# Patient Record
Sex: Female | Born: 1935 | Race: White | Hispanic: No | Marital: Single | Smoking: Former smoker
Health system: Southern US, Community
[De-identification: ages and names within clinical notes are randomized; demographics above are authoritative.]

## PROBLEM LIST (undated history)

## (undated) DIAGNOSIS — D519 Vitamin B12 deficiency anemia, unspecified: Secondary | ICD-10-CM

## (undated) DIAGNOSIS — F329 Major depressive disorder, single episode, unspecified: Secondary | ICD-10-CM

## (undated) DIAGNOSIS — I10 Essential (primary) hypertension: Secondary | ICD-10-CM

## (undated) DIAGNOSIS — J449 Chronic obstructive pulmonary disease, unspecified: Secondary | ICD-10-CM

## (undated) DIAGNOSIS — K219 Gastro-esophageal reflux disease without esophagitis: Secondary | ICD-10-CM

## (undated) DIAGNOSIS — R05 Cough: Secondary | ICD-10-CM

## (undated) DIAGNOSIS — Z87891 Personal history of nicotine dependence: Secondary | ICD-10-CM

## (undated) DIAGNOSIS — E785 Hyperlipidemia, unspecified: Secondary | ICD-10-CM

## (undated) DIAGNOSIS — M4800 Spinal stenosis, site unspecified: Secondary | ICD-10-CM

## (undated) DIAGNOSIS — F419 Anxiety disorder, unspecified: Secondary | ICD-10-CM

## (undated) DIAGNOSIS — M549 Dorsalgia, unspecified: Secondary | ICD-10-CM

## (undated) DIAGNOSIS — G8929 Other chronic pain: Secondary | ICD-10-CM

## (undated) DIAGNOSIS — M8580 Other specified disorders of bone density and structure, unspecified site: Secondary | ICD-10-CM

## (undated) DIAGNOSIS — H9190 Unspecified hearing loss, unspecified ear: Secondary | ICD-10-CM

## (undated) DIAGNOSIS — K5790 Diverticulosis of intestine, part unspecified, without perforation or abscess without bleeding: Secondary | ICD-10-CM

## (undated) DIAGNOSIS — S32000A Wedge compression fracture of unspecified lumbar vertebra, initial encounter for closed fracture: Secondary | ICD-10-CM

## (undated) DIAGNOSIS — J45909 Unspecified asthma, uncomplicated: Secondary | ICD-10-CM

## (undated) HISTORY — DX: Vitamin B12 deficiency anemia, unspecified: D51.9

## (undated) HISTORY — PX: OTHER SURGICAL HISTORY: SHX169

## (undated) HISTORY — PX: CATARACT EXTRACTION, BILATERAL: SHX1313

## (undated) HISTORY — DX: Gastro-esophageal reflux disease without esophagitis: K21.9

## (undated) HISTORY — DX: Personal history of nicotine dependence: Z87.891

## (undated) HISTORY — DX: Diverticulosis of intestine, part unspecified, without perforation or abscess without bleeding: K57.90

## (undated) HISTORY — DX: Other chronic pain: G89.29

## (undated) HISTORY — DX: Spinal stenosis, site unspecified: M48.00

## (undated) HISTORY — DX: Essential (primary) hypertension: I10

## (undated) HISTORY — PX: APPENDECTOMY: SHX54

## (undated) HISTORY — DX: Cough: R05

## (undated) HISTORY — DX: Dorsalgia, unspecified: M54.9

## (undated) HISTORY — DX: Wedge compression fracture of unspecified lumbar vertebra, initial encounter for closed fracture: S32.000A

## (undated) HISTORY — DX: Other specified disorders of bone density and structure, unspecified site: M85.80

## (undated) HISTORY — DX: Hyperlipidemia, unspecified: E78.5

## (undated) HISTORY — DX: Major depressive disorder, single episode, unspecified: F32.9

## (undated) HISTORY — DX: Anxiety disorder, unspecified: F41.9

---

## 1999-01-21 DIAGNOSIS — Z87891 Personal history of nicotine dependence: Secondary | ICD-10-CM

## 1999-01-21 HISTORY — DX: Personal history of nicotine dependence: Z87.891

## 2000-09-20 ENCOUNTER — Emergency Department (HOSPITAL_COMMUNITY): Admission: EM | Admit: 2000-09-20 | Discharge: 2000-09-20 | Payer: Self-pay | Admitting: Emergency Medicine

## 2008-01-21 HISTORY — PX: KYPHOPLASTY: SHX5884

## 2015-03-07 DIAGNOSIS — M25512 Pain in left shoulder: Secondary | ICD-10-CM | POA: Diagnosis not present

## 2015-04-30 DIAGNOSIS — Z961 Presence of intraocular lens: Secondary | ICD-10-CM | POA: Diagnosis not present

## 2015-05-01 DIAGNOSIS — M25552 Pain in left hip: Secondary | ICD-10-CM | POA: Diagnosis not present

## 2015-05-01 DIAGNOSIS — M1612 Unilateral primary osteoarthritis, left hip: Secondary | ICD-10-CM | POA: Diagnosis not present

## 2015-05-04 DIAGNOSIS — M25552 Pain in left hip: Secondary | ICD-10-CM | POA: Diagnosis not present

## 2015-05-04 DIAGNOSIS — J45909 Unspecified asthma, uncomplicated: Secondary | ICD-10-CM | POA: Diagnosis not present

## 2015-05-04 DIAGNOSIS — M25551 Pain in right hip: Secondary | ICD-10-CM | POA: Diagnosis not present

## 2015-06-20 DIAGNOSIS — M1612 Unilateral primary osteoarthritis, left hip: Secondary | ICD-10-CM | POA: Diagnosis not present

## 2015-06-20 DIAGNOSIS — M25551 Pain in right hip: Secondary | ICD-10-CM | POA: Diagnosis not present

## 2015-06-20 DIAGNOSIS — Z2239 Carrier of other specified bacterial diseases: Secondary | ICD-10-CM | POA: Diagnosis not present

## 2015-06-20 DIAGNOSIS — M79605 Pain in left leg: Secondary | ICD-10-CM | POA: Diagnosis not present

## 2015-06-26 DIAGNOSIS — M1612 Unilateral primary osteoarthritis, left hip: Secondary | ICD-10-CM | POA: Diagnosis not present

## 2015-07-02 DIAGNOSIS — Z96642 Presence of left artificial hip joint: Secondary | ICD-10-CM | POA: Diagnosis not present

## 2015-07-02 DIAGNOSIS — M169 Osteoarthritis of hip, unspecified: Secondary | ICD-10-CM | POA: Diagnosis not present

## 2015-07-02 DIAGNOSIS — Z471 Aftercare following joint replacement surgery: Secondary | ICD-10-CM | POA: Diagnosis not present

## 2015-07-02 DIAGNOSIS — I10 Essential (primary) hypertension: Secondary | ICD-10-CM | POA: Diagnosis present

## 2015-07-02 DIAGNOSIS — J449 Chronic obstructive pulmonary disease, unspecified: Secondary | ICD-10-CM | POA: Diagnosis present

## 2015-07-02 DIAGNOSIS — M1612 Unilateral primary osteoarthritis, left hip: Secondary | ICD-10-CM | POA: Diagnosis not present

## 2015-07-02 DIAGNOSIS — K219 Gastro-esophageal reflux disease without esophagitis: Secondary | ICD-10-CM | POA: Diagnosis present

## 2015-07-05 DIAGNOSIS — Z96642 Presence of left artificial hip joint: Secondary | ICD-10-CM | POA: Diagnosis not present

## 2015-07-05 DIAGNOSIS — Z471 Aftercare following joint replacement surgery: Secondary | ICD-10-CM | POA: Diagnosis not present

## 2015-07-09 DIAGNOSIS — Z471 Aftercare following joint replacement surgery: Secondary | ICD-10-CM | POA: Diagnosis not present

## 2015-07-09 DIAGNOSIS — Z96642 Presence of left artificial hip joint: Secondary | ICD-10-CM | POA: Diagnosis not present

## 2015-07-11 DIAGNOSIS — Z96642 Presence of left artificial hip joint: Secondary | ICD-10-CM | POA: Diagnosis not present

## 2015-07-11 DIAGNOSIS — Z471 Aftercare following joint replacement surgery: Secondary | ICD-10-CM | POA: Diagnosis not present

## 2015-07-16 DIAGNOSIS — Z96642 Presence of left artificial hip joint: Secondary | ICD-10-CM | POA: Diagnosis not present

## 2015-07-16 DIAGNOSIS — Z471 Aftercare following joint replacement surgery: Secondary | ICD-10-CM | POA: Diagnosis not present

## 2015-07-18 DIAGNOSIS — Z471 Aftercare following joint replacement surgery: Secondary | ICD-10-CM | POA: Diagnosis not present

## 2015-07-18 DIAGNOSIS — Z96642 Presence of left artificial hip joint: Secondary | ICD-10-CM | POA: Diagnosis not present

## 2015-07-23 DIAGNOSIS — Z96642 Presence of left artificial hip joint: Secondary | ICD-10-CM | POA: Diagnosis not present

## 2015-07-23 DIAGNOSIS — Z471 Aftercare following joint replacement surgery: Secondary | ICD-10-CM | POA: Diagnosis not present

## 2015-07-27 DIAGNOSIS — Z96642 Presence of left artificial hip joint: Secondary | ICD-10-CM | POA: Diagnosis not present

## 2015-07-27 DIAGNOSIS — Z471 Aftercare following joint replacement surgery: Secondary | ICD-10-CM | POA: Diagnosis not present

## 2015-07-30 DIAGNOSIS — Z471 Aftercare following joint replacement surgery: Secondary | ICD-10-CM | POA: Diagnosis not present

## 2015-07-30 DIAGNOSIS — Z96642 Presence of left artificial hip joint: Secondary | ICD-10-CM | POA: Diagnosis not present

## 2015-07-31 DIAGNOSIS — Z471 Aftercare following joint replacement surgery: Secondary | ICD-10-CM | POA: Diagnosis not present

## 2015-07-31 DIAGNOSIS — Z96642 Presence of left artificial hip joint: Secondary | ICD-10-CM | POA: Diagnosis not present

## 2015-08-02 DIAGNOSIS — M1612 Unilateral primary osteoarthritis, left hip: Secondary | ICD-10-CM | POA: Diagnosis not present

## 2015-08-02 DIAGNOSIS — Z471 Aftercare following joint replacement surgery: Secondary | ICD-10-CM | POA: Diagnosis not present

## 2015-08-02 DIAGNOSIS — Z96642 Presence of left artificial hip joint: Secondary | ICD-10-CM | POA: Diagnosis not present

## 2015-08-06 DIAGNOSIS — Z471 Aftercare following joint replacement surgery: Secondary | ICD-10-CM | POA: Diagnosis not present

## 2015-08-06 DIAGNOSIS — Z96642 Presence of left artificial hip joint: Secondary | ICD-10-CM | POA: Diagnosis not present

## 2015-08-08 DIAGNOSIS — Z471 Aftercare following joint replacement surgery: Secondary | ICD-10-CM | POA: Diagnosis not present

## 2015-08-08 DIAGNOSIS — Z96642 Presence of left artificial hip joint: Secondary | ICD-10-CM | POA: Diagnosis not present

## 2015-08-13 DIAGNOSIS — Z96642 Presence of left artificial hip joint: Secondary | ICD-10-CM | POA: Diagnosis not present

## 2015-08-13 DIAGNOSIS — Z471 Aftercare following joint replacement surgery: Secondary | ICD-10-CM | POA: Diagnosis not present

## 2015-08-14 DIAGNOSIS — Z471 Aftercare following joint replacement surgery: Secondary | ICD-10-CM | POA: Diagnosis not present

## 2015-08-14 DIAGNOSIS — Z96642 Presence of left artificial hip joint: Secondary | ICD-10-CM | POA: Diagnosis not present

## 2015-08-15 DIAGNOSIS — Z96642 Presence of left artificial hip joint: Secondary | ICD-10-CM | POA: Diagnosis not present

## 2015-08-15 DIAGNOSIS — Z471 Aftercare following joint replacement surgery: Secondary | ICD-10-CM | POA: Diagnosis not present

## 2015-08-16 DIAGNOSIS — Z96642 Presence of left artificial hip joint: Secondary | ICD-10-CM | POA: Diagnosis not present

## 2015-08-16 DIAGNOSIS — Z471 Aftercare following joint replacement surgery: Secondary | ICD-10-CM | POA: Diagnosis not present

## 2015-08-20 DIAGNOSIS — R1011 Right upper quadrant pain: Secondary | ICD-10-CM | POA: Diagnosis not present

## 2015-08-20 DIAGNOSIS — I10 Essential (primary) hypertension: Secondary | ICD-10-CM | POA: Diagnosis not present

## 2015-08-20 DIAGNOSIS — J45909 Unspecified asthma, uncomplicated: Secondary | ICD-10-CM | POA: Diagnosis not present

## 2015-08-20 DIAGNOSIS — G629 Polyneuropathy, unspecified: Secondary | ICD-10-CM | POA: Diagnosis not present

## 2015-08-20 DIAGNOSIS — Z96649 Presence of unspecified artificial hip joint: Secondary | ICD-10-CM | POA: Diagnosis not present

## 2015-09-06 DIAGNOSIS — G5622 Lesion of ulnar nerve, left upper limb: Secondary | ICD-10-CM | POA: Diagnosis not present

## 2015-09-06 DIAGNOSIS — M1612 Unilateral primary osteoarthritis, left hip: Secondary | ICD-10-CM | POA: Diagnosis not present

## 2015-09-18 DIAGNOSIS — R110 Nausea: Secondary | ICD-10-CM | POA: Diagnosis not present

## 2015-10-26 DIAGNOSIS — M25572 Pain in left ankle and joints of left foot: Secondary | ICD-10-CM | POA: Diagnosis not present

## 2015-11-05 DIAGNOSIS — M76822 Posterior tibial tendinitis, left leg: Secondary | ICD-10-CM | POA: Diagnosis not present

## 2015-11-05 DIAGNOSIS — R601 Generalized edema: Secondary | ICD-10-CM | POA: Diagnosis not present

## 2015-11-05 DIAGNOSIS — M79672 Pain in left foot: Secondary | ICD-10-CM | POA: Diagnosis not present

## 2015-11-05 DIAGNOSIS — R262 Difficulty in walking, not elsewhere classified: Secondary | ICD-10-CM | POA: Diagnosis not present

## 2015-11-13 DIAGNOSIS — M21862 Other specified acquired deformities of left lower leg: Secondary | ICD-10-CM | POA: Diagnosis not present

## 2015-11-13 DIAGNOSIS — M76822 Posterior tibial tendinitis, left leg: Secondary | ICD-10-CM | POA: Diagnosis not present

## 2015-11-29 DIAGNOSIS — M21862 Other specified acquired deformities of left lower leg: Secondary | ICD-10-CM | POA: Diagnosis not present

## 2015-11-29 DIAGNOSIS — G5622 Lesion of ulnar nerve, left upper limb: Secondary | ICD-10-CM | POA: Diagnosis not present

## 2015-11-29 DIAGNOSIS — R202 Paresthesia of skin: Secondary | ICD-10-CM | POA: Diagnosis not present

## 2015-11-29 DIAGNOSIS — M76822 Posterior tibial tendinitis, left leg: Secondary | ICD-10-CM | POA: Diagnosis not present

## 2015-12-03 DIAGNOSIS — G5622 Lesion of ulnar nerve, left upper limb: Secondary | ICD-10-CM | POA: Diagnosis not present

## 2015-12-06 DIAGNOSIS — Z23 Encounter for immunization: Secondary | ICD-10-CM | POA: Diagnosis not present

## 2016-04-16 DIAGNOSIS — M4800 Spinal stenosis, site unspecified: Secondary | ICD-10-CM | POA: Diagnosis not present

## 2016-04-16 DIAGNOSIS — K219 Gastro-esophageal reflux disease without esophagitis: Secondary | ICD-10-CM | POA: Diagnosis not present

## 2016-04-16 DIAGNOSIS — I10 Essential (primary) hypertension: Secondary | ICD-10-CM | POA: Diagnosis not present

## 2016-04-16 DIAGNOSIS — Z23 Encounter for immunization: Secondary | ICD-10-CM | POA: Diagnosis not present

## 2016-04-16 DIAGNOSIS — E785 Hyperlipidemia, unspecified: Secondary | ICD-10-CM | POA: Diagnosis not present

## 2016-04-17 DIAGNOSIS — I10 Essential (primary) hypertension: Secondary | ICD-10-CM | POA: Diagnosis not present

## 2016-04-17 DIAGNOSIS — D649 Anemia, unspecified: Secondary | ICD-10-CM | POA: Diagnosis not present

## 2016-04-17 DIAGNOSIS — K219 Gastro-esophageal reflux disease without esophagitis: Secondary | ICD-10-CM | POA: Diagnosis not present

## 2016-04-17 DIAGNOSIS — E785 Hyperlipidemia, unspecified: Secondary | ICD-10-CM | POA: Diagnosis not present

## 2016-04-17 DIAGNOSIS — F419 Anxiety disorder, unspecified: Secondary | ICD-10-CM | POA: Diagnosis not present

## 2016-04-17 DIAGNOSIS — M8580 Other specified disorders of bone density and structure, unspecified site: Secondary | ICD-10-CM | POA: Diagnosis not present

## 2016-04-17 DIAGNOSIS — F329 Major depressive disorder, single episode, unspecified: Secondary | ICD-10-CM | POA: Diagnosis not present

## 2016-04-17 DIAGNOSIS — J449 Chronic obstructive pulmonary disease, unspecified: Secondary | ICD-10-CM | POA: Diagnosis not present

## 2016-04-17 DIAGNOSIS — M25519 Pain in unspecified shoulder: Secondary | ICD-10-CM | POA: Diagnosis not present

## 2016-04-17 DIAGNOSIS — Z79899 Other long term (current) drug therapy: Secondary | ICD-10-CM | POA: Diagnosis not present

## 2016-04-17 DIAGNOSIS — R109 Unspecified abdominal pain: Secondary | ICD-10-CM | POA: Diagnosis not present

## 2016-04-17 DIAGNOSIS — G629 Polyneuropathy, unspecified: Secondary | ICD-10-CM | POA: Diagnosis not present

## 2016-05-14 DIAGNOSIS — H353190 Nonexudative age-related macular degeneration, unspecified eye, stage unspecified: Secondary | ICD-10-CM | POA: Diagnosis not present

## 2016-05-14 DIAGNOSIS — H353191 Nonexudative age-related macular degeneration, unspecified eye, early dry stage: Secondary | ICD-10-CM | POA: Diagnosis not present

## 2016-05-14 DIAGNOSIS — H353131 Nonexudative age-related macular degeneration, bilateral, early dry stage: Secondary | ICD-10-CM | POA: Diagnosis not present

## 2016-05-14 DIAGNOSIS — Z961 Presence of intraocular lens: Secondary | ICD-10-CM | POA: Diagnosis not present

## 2016-11-07 DIAGNOSIS — Z23 Encounter for immunization: Secondary | ICD-10-CM | POA: Diagnosis not present

## 2017-01-08 DIAGNOSIS — J441 Chronic obstructive pulmonary disease with (acute) exacerbation: Secondary | ICD-10-CM | POA: Diagnosis not present

## 2017-01-08 DIAGNOSIS — J020 Streptococcal pharyngitis: Secondary | ICD-10-CM | POA: Diagnosis not present

## 2017-01-11 ENCOUNTER — Encounter (HOSPITAL_COMMUNITY): Payer: Self-pay | Admitting: Emergency Medicine

## 2017-01-11 ENCOUNTER — Emergency Department (HOSPITAL_COMMUNITY): Payer: Medicare Other

## 2017-01-11 ENCOUNTER — Emergency Department (HOSPITAL_COMMUNITY)
Admission: EM | Admit: 2017-01-11 | Discharge: 2017-01-11 | Disposition: A | Discharge status: Home / Self Care | Payer: Medicare Other | Attending: Emergency Medicine | Admitting: Emergency Medicine

## 2017-01-11 DIAGNOSIS — J029 Acute pharyngitis, unspecified: Secondary | ICD-10-CM | POA: Insufficient documentation

## 2017-01-11 DIAGNOSIS — J441 Chronic obstructive pulmonary disease with (acute) exacerbation: Secondary | ICD-10-CM | POA: Insufficient documentation

## 2017-01-11 DIAGNOSIS — R05 Cough: Secondary | ICD-10-CM | POA: Diagnosis not present

## 2017-01-11 HISTORY — DX: Chronic obstructive pulmonary disease, unspecified: J44.9

## 2017-01-11 HISTORY — DX: Unspecified asthma, uncomplicated: J45.909

## 2017-01-11 LAB — BLOOD GAS, VENOUS
Acid-Base Excess: 6 mmol/L — ABNORMAL HIGH (ref 0.0–2.0)
Bicarbonate: 29.9 mmol/L — ABNORMAL HIGH (ref 20.0–28.0)
DRAWN BY: 514251
O2 CONTENT: 21 L/min
O2 SAT: 34.1 %
PATIENT TEMPERATURE: 98.6
pCO2, Ven: 41.6 mmHg — ABNORMAL LOW (ref 44.0–60.0)
pH, Ven: 7.47 — ABNORMAL HIGH (ref 7.250–7.430)

## 2017-01-11 LAB — CBC WITH DIFFERENTIAL/PLATELET
BASOS PCT: 0 %
Basophils Absolute: 0 10*3/uL (ref 0.0–0.1)
EOS ABS: 0 10*3/uL (ref 0.0–0.7)
Eosinophils Relative: 0 %
HCT: 39.5 % (ref 36.0–46.0)
HEMOGLOBIN: 13.2 g/dL (ref 12.0–15.0)
Lymphocytes Relative: 12 %
Lymphs Abs: 1.1 10*3/uL (ref 0.7–4.0)
MCH: 28.9 pg (ref 26.0–34.0)
MCHC: 33.4 g/dL (ref 30.0–36.0)
MCV: 86.4 fL (ref 78.0–100.0)
MONOS PCT: 10 %
Monocytes Absolute: 0.9 10*3/uL (ref 0.1–1.0)
NEUTROS PCT: 78 %
Neutro Abs: 7.2 10*3/uL (ref 1.7–7.7)
Platelets: 234 10*3/uL (ref 150–400)
RBC: 4.57 MIL/uL (ref 3.87–5.11)
RDW: 14.1 % (ref 11.5–15.5)
WBC: 9.2 10*3/uL (ref 4.0–10.5)

## 2017-01-11 LAB — COMPREHENSIVE METABOLIC PANEL
ALBUMIN: 3.9 g/dL (ref 3.5–5.0)
ALK PHOS: 56 U/L (ref 38–126)
ALT: 26 U/L (ref 14–54)
ANION GAP: 11 (ref 5–15)
AST: 42 U/L — ABNORMAL HIGH (ref 15–41)
BUN: 24 mg/dL — ABNORMAL HIGH (ref 6–20)
CO2: 27 mmol/L (ref 22–32)
Calcium: 9 mg/dL (ref 8.9–10.3)
Chloride: 100 mmol/L — ABNORMAL LOW (ref 101–111)
Creatinine, Ser: 1.03 mg/dL — ABNORMAL HIGH (ref 0.44–1.00)
GFR calc Af Amer: 58 mL/min — ABNORMAL LOW (ref >60)
GFR calc non Af Amer: 50 mL/min — ABNORMAL LOW (ref >60)
GLUCOSE: 117 mg/dL — AB (ref 65–99)
POTASSIUM: 3.2 mmol/L — AB (ref 3.5–5.1)
SODIUM: 138 mmol/L (ref 135–145)
Total Bilirubin: 0.4 mg/dL (ref 0.3–1.2)
Total Protein: 7.5 g/dL (ref 6.5–8.1)

## 2017-01-11 MED ORDER — METHYLPREDNISOLONE SODIUM SUCC 125 MG IJ SOLR
125.0000 mg | Freq: Once | INTRAMUSCULAR | Status: AC
  Administered 2017-01-11: 125 mg via INTRAVENOUS
  Filled 2017-01-11: qty 2

## 2017-01-11 MED ORDER — OXYMETAZOLINE HCL 0.05 % NA SOLN
1.0000 | Freq: Once | NASAL | Status: AC
Start: 2017-01-11 — End: 2017-01-11
  Administered 2017-01-11: 1 via NASAL
  Filled 2017-01-11: qty 15

## 2017-01-11 MED ORDER — PREDNISONE 10 MG PO TABS: ORAL_TABLET | ORAL | 0 refills | Status: DC

## 2017-01-11 MED ORDER — IPRATROPIUM BROMIDE 0.02 % IN SOLN
0.5000 mg | Freq: Once | RESPIRATORY_TRACT | Status: AC
  Administered 2017-01-11: 0.5 mg via RESPIRATORY_TRACT
  Filled 2017-01-11: qty 2.5

## 2017-01-11 MED ORDER — SODIUM CHLORIDE 0.9 % IV BOLUS (SEPSIS)
1000.0000 mL | Freq: Once | INTRAVENOUS | Status: AC
  Administered 2017-01-11: 1000 mL via INTRAVENOUS

## 2017-01-11 MED ORDER — ALBUTEROL SULFATE (2.5 MG/3ML) 0.083% IN NEBU
5.0000 mg | INHALATION_SOLUTION | Freq: Once | RESPIRATORY_TRACT | Status: AC
  Administered 2017-01-11: 5 mg via RESPIRATORY_TRACT
  Filled 2017-01-11: qty 6

## 2017-01-11 MED ORDER — ALBUTEROL SULFATE (2.5 MG/3ML) 0.083% IN NEBU: 2.5000 mg | INHALATION_SOLUTION | Freq: Four times a day (QID) | RESPIRATORY_TRACT | 12 refills | Status: DC | PRN

## 2017-01-11 MED ORDER — HYDROCOD POLST-CPM POLST ER 10-8 MG/5ML PO SUER: 5.0000 mL | Freq: Every evening | ORAL | 0 refills | Status: DC | PRN

## 2017-01-11 NOTE — ED Triage Notes (Addendum)
Patient c/o sore throat, cough, and congestion since 3 days. Reports she was diagnosed with [facility] at [facility]. States she has been taking amoxicillin as prescribed with no relief. Hx pneumonia, asthma, and COPD.

## 2017-01-11 NOTE — ED Provider Notes (Signed)
[facility]-EMERGENCY DEPT Provider Note   CSN: [id number] Arrival date & time: 01/11/17  1631     History   Chief Complaint Chief Complaint  Patient presents with  . Cough  . Sore Throat    HPI Desiree [name] is a 81 y.o. female history of asthma, COPD here presenting with worsening cough, congestion.  Patient only moved here from [place].  Patient had some sore throat and cough and congestion about 3 days ago.  Patient went to urgent care and had a positive strep and patient was given a prescription of amoxicillin, prednisone taper, albuterol as needed.  Patient states that despite taking these medicines, she has been having persistent congestion and cough.  States that she has been having some wheezing as well.  Denies any persistent fevers.  Patient was concern for COPD exacerbation.  Patient states that she had no recent admission for COPD.   The history is provided by the patient.    Past Medical History:  Diagnosis Date  . Asthma   . COPD (chronic obstructive pulmonary disease) (HCC)     There are no active problems to display for this patient.    OB History    No data available       Home Medications    Prior to Admission medications   Medication Sig Start Date End Date Taking? Authorizing Provider  albuterol (PROVENTIL HFA;VENTOLIN HFA) 108 (90 Base) MCG/ACT inhaler Inhale 1-2 puffs into the lungs every 4 (four) hours as needed for wheezing or shortness of breath.   Yes [provider]  amoxicillin (AMOXIL) 500 MG tablet Take 500 mg by mouth 3 (three) times daily.   Yes [provider]  benzonatate (TESSALON) 200 MG capsule Take 200 mg by mouth 3 (three) times daily as needed for cough.   Yes [provider]  BUPROPION HCL ER, XL, PO Take 1 tablet by mouth daily.   Yes [provider]  CYANOCOBALAMIN PO Take 1 tablet by mouth at bedtime.   Yes [provider]  Fluticasone-Salmeterol (ADVAIR)  250-50 MCG/DOSE AEPB Inhale 1 puff into the lungs 2 (two) times daily.   Yes [provider]  HYDROCHLOROTHIAZIDE PO Take 1 tablet by mouth daily.   Yes [provider]  montelukast (SINGULAIR) 10 MG tablet Take 10 mg by mouth at bedtime.   Yes [provider]  FLUZONE HIGH-DOSE 0.5 ML injection Inject 0.5 mLs into the muscle once. 11/07/16   [provider]  predniSONE (DELTASONE) 10 MG tablet Take 10 mg by mouth See admin instructions. 40mg  day 1, 30 mg day 2, 20 mg day 3, 10 mg day 4    [provider]    Family History No family history on file.  Social History Social History   Tobacco Use  . Smoking status: Not on file  Substance Use Topics  . Alcohol use: Not on file  . Drug use: Not on file     Allergies   Levaquin [levofloxacin] and Percocet [oxycodone-acetaminophen]   Review of Systems Review of Systems  Respiratory: Positive for cough.   All other systems reviewed and are negative.    Physical Exam Updated Vital Signs BP 114/69   Pulse 70   Temp 98.3 F (36.8 C) (Oral)   Resp 14   SpO2 98%   Physical Exam  Constitutional: She appears well-developed.  Slightly tachypneic, + coughing   HENT:  Head: Normocephalic.  Mouth/Throat: Uvula is midline, oropharynx is clear and moist  and mucous membranes are normal.  Eyes: EOM are normal. Pupils are equal, round, and reactive to light.  Neck: Normal range of motion. Neck supple.  Cardiovascular: Normal rate, regular rhythm and normal heart sounds.  Pulmonary/Chest:  Slightly tachypneic, mild diffuse wheezing, no retractions   Abdominal: Soft. Bowel sounds are normal.  Neurological: She is alert.  Skin: Skin is warm.  Psychiatric: She has a normal mood and affect.  Nursing note and vitals reviewed.    ED Treatments / Results  Labs (all labs ordered are listed, but only abnormal results are displayed) Labs Reviewed  BLOOD GAS, VENOUS - Abnormal; Notable for the  following components:      Result Value   pH, Ven 7.470 (*)    pCO2, Ven 41.6 (*)    Bicarbonate 29.9 (*)    Acid-Base Excess 6.0 (*)    All other components within normal limits  COMPREHENSIVE METABOLIC PANEL - Abnormal; Notable for the following components:   Potassium 3.2 (*)    Chloride 100 (*)    Glucose, Bld 117 (*)    BUN 24 (*)    Creatinine, Ser 1.03 (*)    AST 42 (*)    GFR calc non Af Amer 50 (*)    GFR calc Af Amer 58 (*)    All other components within normal limits  CBC WITH DIFFERENTIAL/PLATELET    EKG  EKG Interpretation None       Radiology Dg Chest 2 View  Result Date: 01/11/2017 CLINICAL DATA:  Cough EXAM: CHEST  2 VIEW COMPARISON:  None. FINDINGS: There is hyperinflation of the lungs compatible with COPD. Heart and mediastinal contours are within normal limits. No focal opacities or effusions. No acute bony abnormality. IMPRESSION: COPD.  No active disease. Electronically Signed   By: Charlett NoseKevin  Dover M.D.   On: 01/11/2017 17:46    Procedures Procedures (including critical care time)  Medications Ordered in ED Medications  albuterol (PROVENTIL) (2.5 MG/3ML) 0.083% nebulizer solution 5 mg (5 mg Nebulization Given 01/11/17 1950)  oxymetazoline (AFRIN) 0.05 % nasal spray 1 spray (1 spray Each Nare Given 01/11/17 1938)  methylPREDNISolone sodium succinate (SOLU-MEDROL) 125 mg/2 mL injection 125 mg (125 mg Intravenous Given 01/11/17 1938)  sodium chloride 0.9 % bolus 1,000 mL (1,000 mLs Intravenous New Bag/Given 01/11/17 1938)  ipratropium (ATROVENT) nebulizer solution 0.5 mg (0.5 mg Nebulization Given 01/11/17 1950)     Initial Impression / Assessment and Plan / ED Course  I have reviewed the triage vital signs and the nursing notes.  Pertinent labs & imaging results that were available during my care of the patient were reviewed by me and considered in my medical decision making (see chart for details).     Desiree [name] is a 81 y.o. female here with  cough, congestion. Likely COPD exacerbation. Still on amoxicillin for strep pharyngitis and OP appears clear with no signs of PTA or RPA. Has diffuse wheezing but doesn't appear in distress. Will get labs, CXR. Will give nebs, steroids.   8:33 PM Labs unremarkable. PH 7.47, CO2 41. CXR showed no pneumonia. Given nebs, steroids, wheezing improved. No distress. Will dc home with another course of prednisone, albuterol nebs prn. She requests tussionex for cough and I told her to only use it at night as needed.   Final Clinical Impressions(s) / ED Diagnoses   Final diagnoses:  None    ED Discharge Orders    None       Charlynne PanderYao, Lavone Barrientes Hsienta, MD 01/11/17  2035  

## 2017-01-11 NOTE — Discharge Instructions (Signed)
Take prednisone as prescribed.   Finish amoxicillin as prescribed.   Use albuterol every 4-6 hrs as needed for cough or wheezing.   You may take tussionex at night for cough. It will make you sleepy   See your doctor in a week   Return to ER if you have worse cough, shortness of breath, trouble breathing.

## 2017-02-13 ENCOUNTER — Other Ambulatory Visit (INDEPENDENT_AMBULATORY_CARE_PROVIDER_SITE_OTHER): Payer: Medicare Other

## 2017-02-13 ENCOUNTER — Encounter: Payer: Self-pay | Admitting: Internal Medicine

## 2017-02-13 ENCOUNTER — Ambulatory Visit (INDEPENDENT_AMBULATORY_CARE_PROVIDER_SITE_OTHER): Payer: Medicare Other | Admitting: Internal Medicine

## 2017-02-13 VITALS — BP 150/90 | HR 74 | Temp 97.9°F | Ht 62.0 in | Wt 142.5 lb

## 2017-02-13 DIAGNOSIS — I10 Essential (primary) hypertension: Secondary | ICD-10-CM | POA: Diagnosis not present

## 2017-02-13 DIAGNOSIS — J441 Chronic obstructive pulmonary disease with (acute) exacerbation: Secondary | ICD-10-CM

## 2017-02-13 DIAGNOSIS — M4800 Spinal stenosis, site unspecified: Secondary | ICD-10-CM | POA: Insufficient documentation

## 2017-02-13 DIAGNOSIS — R05 Cough: Secondary | ICD-10-CM | POA: Insufficient documentation

## 2017-02-13 DIAGNOSIS — S32000A Wedge compression fracture of unspecified lumbar vertebra, initial encounter for closed fracture: Secondary | ICD-10-CM | POA: Insufficient documentation

## 2017-02-13 DIAGNOSIS — F419 Anxiety disorder, unspecified: Secondary | ICD-10-CM

## 2017-02-13 DIAGNOSIS — R739 Hyperglycemia, unspecified: Secondary | ICD-10-CM

## 2017-02-13 DIAGNOSIS — K5790 Diverticulosis of intestine, part unspecified, without perforation or abscess without bleeding: Secondary | ICD-10-CM

## 2017-02-13 DIAGNOSIS — F329 Major depressive disorder, single episode, unspecified: Secondary | ICD-10-CM | POA: Insufficient documentation

## 2017-02-13 DIAGNOSIS — G8929 Other chronic pain: Secondary | ICD-10-CM

## 2017-02-13 DIAGNOSIS — M8580 Other specified disorders of bone density and structure, unspecified site: Secondary | ICD-10-CM

## 2017-02-13 DIAGNOSIS — D519 Vitamin B12 deficiency anemia, unspecified: Secondary | ICD-10-CM

## 2017-02-13 DIAGNOSIS — J45909 Unspecified asthma, uncomplicated: Secondary | ICD-10-CM | POA: Insufficient documentation

## 2017-02-13 DIAGNOSIS — E785 Hyperlipidemia, unspecified: Secondary | ICD-10-CM

## 2017-02-13 DIAGNOSIS — Z0000 Encounter for general adult medical examination without abnormal findings: Secondary | ICD-10-CM

## 2017-02-13 DIAGNOSIS — K219 Gastro-esophageal reflux disease without esophagitis: Secondary | ICD-10-CM | POA: Insufficient documentation

## 2017-02-13 DIAGNOSIS — F32A Depression, unspecified: Secondary | ICD-10-CM

## 2017-02-13 DIAGNOSIS — R053 Chronic cough: Secondary | ICD-10-CM

## 2017-02-13 DIAGNOSIS — J449 Chronic obstructive pulmonary disease, unspecified: Secondary | ICD-10-CM | POA: Insufficient documentation

## 2017-02-13 DIAGNOSIS — Z0001 Encounter for general adult medical examination with abnormal findings: Secondary | ICD-10-CM | POA: Insufficient documentation

## 2017-02-13 DIAGNOSIS — M549 Dorsalgia, unspecified: Secondary | ICD-10-CM

## 2017-02-13 HISTORY — DX: Chronic cough: R05.3

## 2017-02-13 HISTORY — DX: Other chronic pain: G89.29

## 2017-02-13 HISTORY — DX: Gastro-esophageal reflux disease without esophagitis: K21.9

## 2017-02-13 HISTORY — DX: Anxiety disorder, unspecified: F41.9

## 2017-02-13 HISTORY — DX: Essential (primary) hypertension: I10

## 2017-02-13 HISTORY — DX: Vitamin B12 deficiency anemia, unspecified: D51.9

## 2017-02-13 HISTORY — DX: Hyperlipidemia, unspecified: E78.5

## 2017-02-13 HISTORY — DX: Other specified disorders of bone density and structure, unspecified site: M85.80

## 2017-02-13 HISTORY — DX: Spinal stenosis, site unspecified: M48.00

## 2017-02-13 HISTORY — DX: Diverticulosis of intestine, part unspecified, without perforation or abscess without bleeding: K57.90

## 2017-02-13 HISTORY — DX: Depression, unspecified: F32.A

## 2017-02-13 LAB — HEPATIC FUNCTION PANEL
ALK PHOS: 58 U/L (ref 39–117)
ALT: 13 U/L (ref 0–35)
AST: 14 U/L (ref 0–37)
Albumin: 3.9 g/dL (ref 3.5–5.2)
BILIRUBIN DIRECT: 0.1 mg/dL (ref 0.0–0.3)
TOTAL PROTEIN: 7.2 g/dL (ref 6.0–8.3)
Total Bilirubin: 0.4 mg/dL (ref 0.2–1.2)

## 2017-02-13 LAB — URINALYSIS, ROUTINE W REFLEX MICROSCOPIC
BILIRUBIN URINE: NEGATIVE
HGB URINE DIPSTICK: NEGATIVE
Ketones, ur: NEGATIVE
LEUKOCYTES UA: NEGATIVE
Nitrite: NEGATIVE
RBC / HPF: NONE SEEN (ref 0–?)
Specific Gravity, Urine: <=1.005 — AB (ref 1.000–1.030)
Total Protein, Urine: NEGATIVE
Urine Glucose: NEGATIVE
Urobilinogen, UA: 0.2 (ref 0.0–1.0)
WBC, UA: NONE SEEN (ref 0–?)
pH: 6 (ref 5.0–8.0)

## 2017-02-13 LAB — CBC WITH DIFFERENTIAL/PLATELET
BASOS ABS: 0.1 10*3/uL (ref 0.0–0.1)
Basophils Relative: 0.8 % (ref 0.0–3.0)
EOS ABS: 0.1 10*3/uL (ref 0.0–0.7)
Eosinophils Relative: 1.4 % (ref 0.0–5.0)
HEMATOCRIT: 36.7 % (ref 36.0–46.0)
HEMOGLOBIN: 12.2 g/dL (ref 12.0–15.0)
LYMPHS PCT: 26.3 % (ref 12.0–46.0)
Lymphs Abs: 2.9 10*3/uL (ref 0.7–4.0)
MCHC: 33.3 g/dL (ref 30.0–36.0)
MCV: 86.3 fl (ref 78.0–100.0)
MONOS PCT: 10.1 % (ref 3.0–12.0)
Monocytes Absolute: 1.1 10*3/uL — ABNORMAL HIGH (ref 0.1–1.0)
Neutro Abs: 6.7 10*3/uL (ref 1.4–7.7)
Neutrophils Relative %: 61.4 % (ref 43.0–77.0)
Platelets: 374 10*3/uL (ref 150.0–400.0)
RBC: 4.25 Mil/uL (ref 3.87–5.11)
RDW: 14.7 % (ref 11.5–15.5)
WBC: 10.9 10*3/uL — AB (ref 4.0–10.5)

## 2017-02-13 LAB — LIPID PANEL
CHOLESTEROL: 190 mg/dL (ref 0–200)
HDL: 60.5 mg/dL (ref >39.00)
LDL Cholesterol: 102 mg/dL — ABNORMAL HIGH (ref 0–99)
NonHDL: 129.47
Total CHOL/HDL Ratio: 3
Triglycerides: 138 mg/dL (ref 0.0–149.0)
VLDL: 27.6 mg/dL (ref 0.0–40.0)

## 2017-02-13 LAB — BASIC METABOLIC PANEL
BUN: 18 mg/dL (ref 6–23)
CALCIUM: 9.2 mg/dL (ref 8.4–10.5)
CO2: 32 mEq/L (ref 19–32)
CREATININE: 0.96 mg/dL (ref 0.40–1.20)
Chloride: 100 mEq/L (ref 96–112)
GFR: 59.48 mL/min — AB (ref >60.00)
Glucose, Bld: 90 mg/dL (ref 70–99)
Potassium: 3.6 mEq/L (ref 3.5–5.1)
Sodium: 141 mEq/L (ref 135–145)

## 2017-02-13 LAB — HEMOGLOBIN A1C: HEMOGLOBIN A1C: 5.9 % (ref 4.6–6.5)

## 2017-02-13 MED ORDER — AZITHROMYCIN 250 MG PO TABS: ORAL_TABLET | ORAL | 1 refills | Status: DC

## 2017-02-13 MED ORDER — HYDROCODONE-HOMATROPINE 5-1.5 MG/5ML PO SYRP: 5.0000 mL | ORAL_SOLUTION | Freq: Four times a day (QID) | ORAL | 0 refills | Status: AC | PRN

## 2017-02-13 MED ORDER — LOSARTAN POTASSIUM 100 MG PO TABS: 100.0000 mg | ORAL_TABLET | Freq: Every day | ORAL | 3 refills | Status: DC

## 2017-02-13 MED ORDER — FLUTICASONE-SALMETEROL 500-50 MCG/DOSE IN AEPB: 1.0000 | INHALATION_SPRAY | Freq: Two times a day (BID) | RESPIRATORY_TRACT | 0 refills | Status: DC

## 2017-02-13 MED ORDER — PREDNISONE 10 MG PO TABS: ORAL_TABLET | ORAL | 0 refills | Status: DC

## 2017-02-13 NOTE — Assessment & Plan Note (Signed)
Mild, asympt, for a1c with labs, watch closely for polys or cbg > 200 with steroid tx

## 2017-02-13 NOTE — Assessment & Plan Note (Signed)
stable overall by history and exam, and pt to continue medical treatment as before,  to f/u any worsening symptoms or concerns 

## 2017-02-13 NOTE — Patient Instructions (Signed)
Please take all new medication as prescribed - the antibiotic, cough medicine, and prednisone  Please take all new medication as prescribed - the increased advair for one month only  Please continue all other medications as before, and refills have been done if requested.  Please have the pharmacy call with any other refills you may need.  Please continue your efforts at being more active, low cholesterol diet, and weight control.  You are otherwise up to date with prevention measures today.  Please keep your appointments with your specialists as you may have planned  Please go to the LAB in the Basement (turn left off the elevator) for the tests to be done today  You will be contacted by phone if any changes need to be made immediately.  Otherwise, you will receive a letter about your results with an explanation, but please check with MyChart first.  Please remember to sign up for MyChart if you have not done so, as this will be important to you in the future with finding out test results, communicating by private email, and scheduling acute appointments online when needed.  Please return in 6 months, or sooner if needed

## 2017-02-13 NOTE — Assessment & Plan Note (Signed)
Mild elev today likely situational, to cont same tx,  to f/u any worsening symptoms or concerns

## 2017-02-13 NOTE — Assessment & Plan Note (Signed)

## 2017-02-13 NOTE — Progress Notes (Signed)
Subjective:    Patient ID: Desiree [name], female    DOB: 10/23/1935, 82 y.o.   MRN: [id number]  HPI  Here for wellness and to establish as new pt with daughter present and last office visit per prior PCP in hand;  Overall doing ok.  Pt denies neurological change such as new headache, facial or extremity weakness.  Pt denies polydipsia, polyuria, or low sugar symptoms. Pt states overall good compliance with treatment and medications, good tolerability, and has been trying to follow appropriate diet.  Pt denies worsening depressive symptoms, suicidal ideation or panic. No fever, night sweats, wt loss, loss of appetite, or other constitutional symptoms.  Pt states good ability with ADL's, has low fall risk, home safety reviewed and adequate, no other significant changes in hearing or vision, and not active with exercise.  Quit smoking 2001.  BP at home < 140/90.   Also, here with 1 wk onset mild prod cough acute on chronic, without high fever but with several chills, and assoc with ST, neck discomfort, ear fullness bilat,, and bronchial congestion with chest rattling, and few wheezes, very mild worsening sob. Pt denies chest pain, orthopnea, PND, increased LE swelling, palpitations, dizziness or syncope.  Was last seen at ED with similar symptoms dec 2018 Past Medical History:  Diagnosis Date  . Anxiety 02/13/2017  . Asthma   . B12 deficiency anemia 02/13/2017  . Chronic back pain 02/13/2017  . Chronic cough 02/13/2017  . COPD (chronic obstructive pulmonary disease) (HCC)   . Depression 02/13/2017  . Diverticulosis 02/13/2017  . Former smoker 2001  . GERD (gastroesophageal reflux disease) 02/13/2017  . HLD (hyperlipidemia) 02/13/2017  . HTN (hypertension) 02/13/2017  . Lumbar compression fracture (HCC)   . Osteopenia 02/13/2017  . Spinal stenosis 02/13/2017   Past Surgical History:  Procedure Laterality Date  . APPENDECTOMY    . CATARACT EXTRACTION, BILATERAL    . KYPHOPLASTY  2010  . left hip  surgury    . right hip surgury      reports that she has quit smoking. she has never used smokeless tobacco. She reports that she drinks alcohol. She reports that she does not use drugs. family history includes Depression in her father; Lung cancer in her brother and sister; Mesothelioma in her son. Allergies  Allergen Reactions  . Levaquin [Levofloxacin] Hives  . Percocet [Oxycodone-Acetaminophen] Hives   Current Outpatient Medications on File Prior to Visit  Medication Sig Dispense Refill  . albuterol (PROVENTIL) (2.5 MG/3ML) 0.083% nebulizer solution Take 3 mLs (2.5 mg total) by nebulization every 6 (six) hours as needed for wheezing or shortness of breath. 75 mL 12  . BUPROPION HCL ER, XL, PO Take 1 tablet by mouth daily.    . CYANOCOBALAMIN PO Take 1 tablet by mouth at bedtime.    . Fluticasone-Salmeterol (ADVAIR) 250-50 MCG/DOSE AEPB Inhale 1 puff into the lungs 2 (two) times daily.    Marland Kitchen. HYDROCHLOROTHIAZIDE PO Take 1 tablet by mouth daily.    . montelukast (SINGULAIR) 10 MG tablet Take 10 mg by mouth at bedtime.     No current facility-administered medications on file prior to visit.    Review of Systems Constitutional: Negative for other unusual diaphoresis, sweats, appetite or weight changes HENT: Negative for other worsening hearing loss, ear pain, facial swelling, mouth sores or neck stiffness.   Eyes: Negative for other worsening pain, redness or other visual disturbance.  Respiratory: Negative for other stridor or swelling Cardiovascular: Negative for other palpitations or  other chest pain  Gastrointestinal: Negative for worsening diarrhea or loose stools, blood in stool, distention or other pain Genitourinary: Negative for hematuria, flank pain or other change in urine volume.  Musculoskeletal: Negative for myalgias or other joint swelling.  Skin: Negative for other color change, or other wound or worsening drainage.  Neurological: Negative for other syncope or  numbness. Hematological: Negative for other adenopathy or swelling Psychiatric/Behavioral: Negative for hallucinations, other worsening agitation, SI, self-injury, or new decreased concentration All other system neg per pt    Objective:   Physical Exam BP (!) 150/90 (BP Location: Left Arm, Patient Position: Sitting, Cuff Size: Normal)   Pulse 74   Temp 97.9 F (36.6 C) (Oral)   Ht 5\' 2"  (1.575 m)   Wt 142 lb 8 oz (64.6 kg)   SpO2 96%   BMI 26.06 kg/m  VS noted, mild ill Constitutional: Pt is oriented to person, place, and time. Appears well-developed and well-nourished, in no significant distress and comfortable Head: Normocephalic and atraumatic  Eyes: Conjunctivae and EOM are normal. Pupils are equal, round, and reactive to light Right Ear: External ear normal without discharge Left Ear: External ear normal without discharge Nose: Nose without discharge or deformity Mouth/Throat: Oropharynx is without other ulcerations and moist  Neck: Normal range of motion. Neck supple. No JVD present. No tracheal deviation present or significant neck LA or mass Cardiovascular: Normal rate, regular rhythm, normal heart sounds and intact distal pulses.   Pulmonary/Chest: WOB normal and breath sounds decreased bilat without rales but with mild bilat scattered wheezing  Abdominal: Soft. Bowel sounds are normal. NT. No HSM  Musculoskeletal: Normal range of motion. Exhibits no edema Lymphadenopathy: Has no other cervical adenopathy.  Neurological: Pt is alert and oriented to person, place, and time. Pt has normal reflexes. No cranial nerve deficit. Motor grossly intact, Gait intact Skin: Skin is warm and dry. No rash noted or new ulcerations Psychiatric:  Has nervous mood and affect. Behavior is normal without agitation No other exam findings Lab Results  Component Value Date   WBC 9.2 01/11/2017   HGB 13.2 01/11/2017   HCT 39.5 01/11/2017   PLT 234 01/11/2017   GLUCOSE 117 (H) 01/11/2017    ALT 26 01/11/2017   AST 42 (H) 01/11/2017   NA 138 01/11/2017   K 3.2 (L) 01/11/2017   CL 100 (L) 01/11/2017   CREATININE 1.03 (H) 01/11/2017   BUN 24 (H) 01/11/2017   CO2 27 01/11/2017       Assessment & Plan:

## 2017-02-13 NOTE — Assessment & Plan Note (Addendum)
Mild to mod, c/w bronchitis vs pna, declines cxr, for antibx course, cough med prn, predpac asd, and bumped up advair to 500 dosing for 1 month, then return to baseline 250,  to f/u any worsening symptoms or concerns

## 2017-02-16 ENCOUNTER — Encounter: Payer: Self-pay | Admitting: Internal Medicine

## 2017-02-16 LAB — TSH: TSH: 1.74 u[IU]/mL (ref 0.35–4.50)

## 2017-02-26 ENCOUNTER — Telehealth: Payer: Self-pay | Admitting: Internal Medicine

## 2017-02-26 NOTE — Telephone Encounter (Signed)
Medical Records Rec'd from [place] [facility] [place] [facility] - Sent records via interoffice mail to Oliver BarreJames John MD [facility] 02/26/17 LM

## 2017-03-24 ENCOUNTER — Telehealth: Payer: Self-pay | Admitting: Internal Medicine

## 2017-03-24 NOTE — Telephone Encounter (Signed)
I have left a VM for patient to stop back in to sign DPR that has been scanned into the system.  Patient did not sign DPR.

## 2017-07-04 ENCOUNTER — Encounter: Payer: Self-pay | Admitting: Internal Medicine

## 2017-07-04 ENCOUNTER — Ambulatory Visit (INDEPENDENT_AMBULATORY_CARE_PROVIDER_SITE_OTHER): Payer: Medicare Other | Admitting: Internal Medicine

## 2017-07-04 VITALS — BP 142/76 | HR 88 | Temp 98.6°F | Ht 62.0 in | Wt 148.0 lb

## 2017-07-04 DIAGNOSIS — J449 Chronic obstructive pulmonary disease, unspecified: Secondary | ICD-10-CM

## 2017-07-04 DIAGNOSIS — J441 Chronic obstructive pulmonary disease with (acute) exacerbation: Secondary | ICD-10-CM

## 2017-07-04 DIAGNOSIS — J45909 Unspecified asthma, uncomplicated: Secondary | ICD-10-CM

## 2017-07-04 MED ORDER — DOXYCYCLINE HYCLATE 100 MG PO TABS: 100.0000 mg | ORAL_TABLET | Freq: Two times a day (BID) | ORAL | 0 refills | Status: DC

## 2017-07-04 MED ORDER — PREDNISONE 20 MG PO TABS: 20.0000 mg | ORAL_TABLET | Freq: Two times a day (BID) | ORAL | 0 refills | Status: DC

## 2017-07-04 MED ORDER — HYDROCODONE-HOMATROPINE 5-1.5 MG/5ML PO SYRP: 5.0000 mL | ORAL_SOLUTION | Freq: Three times a day (TID) | ORAL | 0 refills | Status: DC | PRN

## 2017-07-04 NOTE — Patient Instructions (Addendum)
Restart  Your albuterol and stay on  Controller.   Add antibiotic    And short  course prednisone .  Caution with cough med .

## 2017-07-04 NOTE — Progress Notes (Signed)
Chief Complaint  Patient presents with  . Cough    HPI: Desiree [name] 82 y.o.  Patient comes in today for [facility] Saturday clinic for   problem evaluation. Here with daughter . [place] [facility] copd  And allergy? And  Hx  pna  In past     Cough  And green phegm for days no blood fever  hasnt used albuterol yet  On controller inhalers and Singulair    usually needs help to keep from progressing    . Can take cough med hydrocodone  And daughter ask for some to help sleep  . No tobacco  Since 2001?         Past Medical History:  Diagnosis Date  . Anxiety 02/13/2017  . Asthma   . B12 deficiency anemia 02/13/2017  . Chronic back pain 02/13/2017  . Chronic cough 02/13/2017  . COPD (chronic obstructive pulmonary disease) (HCC)   . Depression 02/13/2017  . Diverticulosis 02/13/2017  . Former smoker 2001  . GERD (gastroesophageal reflux disease) 02/13/2017  . HLD (hyperlipidemia) 02/13/2017  . HTN (hypertension) 02/13/2017  . Lumbar compression fracture (HCC)   . Osteopenia 02/13/2017  . Spinal stenosis 02/13/2017    Family History  Problem Relation Age of Onset  . Depression Father   . Lung cancer Sister   . Lung cancer Brother   . Mesothelioma Son     Social History   Socioeconomic History  . Marital status: Single    Spouse name: Not on file  . Number of children: Not on file  . Years of education: Not on file  . Highest education level: Not on file  Occupational History  . Not on file  Social Needs  . Financial resource strain: Not on file  . Food insecurity:    Worry: Not on file    Inability: Not on file  . Transportation needs:    Medical: Not on file    Non-medical: Not on file  Tobacco Use  . Smoking status: Former Games developer  . Smokeless tobacco: Never Used  Substance and Sexual Activity  . Alcohol use: Yes  . Drug use: No  . Sexual activity: Not on file  Lifestyle  . Physical activity:    Days per week: Not on file    Minutes per session: Not on file  . Stress: Not on  file  Relationships  . Social connections:    Talks on phone: Not on file    Gets together: Not on file    Attends religious service: Not on file    Active member of club or organization: Not on file    Attends meetings of clubs or organizations: Not on file    Relationship status: Not on file  Other Topics Concern  . Not on file  Social History Narrative  . Not on file    Outpatient Medications Prior to Visit  Medication Sig Dispense Refill  . albuterol (PROVENTIL) (2.5 MG/3ML) 0.083% nebulizer solution Take 3 mLs (2.5 mg total) by nebulization every 6 (six) hours as needed for wheezing or shortness of breath. 75 mL 12  . BUPROPION HCL ER, XL, PO Take 1 tablet by mouth daily.    . CYANOCOBALAMIN PO Take 1 tablet by mouth at bedtime.    . Fluticasone-Salmeterol (ADVAIR DISKUS) 250-50 MCG/DOSE AEPB Advair Diskus 250 mcg-50 mcg/dose powder for inhalation    . HYDROCHLOROTHIAZIDE PO Take 1 tablet by mouth daily.    Marland Kitchen losartan (COZAAR) 100 MG tablet Take 1 tablet (  100 mg total) by mouth daily. 90 tablet 3  . montelukast (SINGULAIR) 10 MG tablet Take 10 mg by mouth at bedtime.    . predniSONE (DELTASONE) 10 MG tablet 2 tabs by mouth per day for 5 days 10 tablet 0  . azithromycin (ZITHROMAX Z-PAK) 250 MG tablet 2 tab by mouth day 1, then 1 per day 6 tablet 1  . Fluticasone-Salmeterol (ADVAIR DISKUS) 500-50 MCG/DOSE AEPB Inhale 1 puff into the lungs 2 (two) times daily. For one month, then resume former 60 each 0  . Fluticasone-Salmeterol (ADVAIR) 250-50 MCG/DOSE AEPB Inhale 1 puff into the lungs 2 (two) times daily.     No facility-administered medications prior to visit.      EXAM:  BP (!) 142/76 (BP Location: Left Arm, Patient Position: Sitting, Cuff Size: Normal)   Pulse 88   Temp 98.6 F (37 C) (Oral)   Ht 5\' 2"  (1.575 m)   Wt 148 lb (67.1 kg)   SpO2 99%   BMI 27.07 kg/m   Body mass index is 27.07 kg/m.  GENERAL: vitals reviewed and listed above, alert, oriented, appears  well hydrated and in no acute distress congested ocass cough    No resp distress   Hoarse non toxic  ocass cough  HEENT: atraumatic, conjunctiva  clear, no obvious abnormalities on inspection of external nose and ears tm clear nared congested no face pain OP : no lesion edema or exudate  NECK: no obvious masses on inspection palpation  LUNGS: exp  Sounds and some rhonchi  But  good air movement CV: HRRR, no clubbing cyanosis or  peripheral edema nl cap refill  MS: moves all extremities without noticeable focal  abnormality PSYCH: pleasant and cooperative, no obvious depression or anxiety Lab Results  Component Value Date   WBC 10.9 (H) 02/13/2017   HGB 12.2 02/13/2017   HCT 36.7 02/13/2017   PLT 374.0 02/13/2017   GLUCOSE 90 02/13/2017   CHOL 190 02/13/2017   TRIG 138.0 02/13/2017   HDL 60.50 02/13/2017   LDLCALC 102 (H) 02/13/2017   ALT 13 02/13/2017   AST 14 02/13/2017   NA 141 02/13/2017   K 3.6 02/13/2017   CL 100 02/13/2017   CREATININE 0.96 02/13/2017   BUN 18 02/13/2017   CO2 32 02/13/2017   TSH 1.74 02/13/2017   HGBA1C 5.9 02/13/2017   BP Readings from Last 3 Encounters:  07/04/17 (!) 142/76  02/13/17 (!) 150/90  01/11/17 (!) 106/55    ASSESSMENT AND PLAN:  Discussed the following assessment and plan:  COPD exacerbation (HCC)  Chronic obstructive pulmonary disease, unspecified COPD type (HCC) Early resp infection   At risk so empiric rx  Antibiotics and  pred  And add her  Albuterol    Expectant management. As discussed   And fu with  PCP if not resolving  Caution with cough med  -Patient advised to return or notify health care team  if  new concerns arise.  Patient Instructions  Restart  Your albuterol and stay on  Controller.   Add antibiotic    And short  course prednisone .  Caution with cough med .     Neta MendsWanda K. Geraldine Sandberg M.D.

## 2017-07-15 ENCOUNTER — Encounter: Payer: Self-pay | Admitting: Internal Medicine

## 2017-07-15 ENCOUNTER — Ambulatory Visit (INDEPENDENT_AMBULATORY_CARE_PROVIDER_SITE_OTHER): Payer: Medicare Other | Admitting: Internal Medicine

## 2017-07-15 VITALS — BP 120/74 | HR 81 | Temp 98.4°F | Ht 62.0 in | Wt 144.0 lb

## 2017-07-15 DIAGNOSIS — J441 Chronic obstructive pulmonary disease with (acute) exacerbation: Secondary | ICD-10-CM | POA: Diagnosis not present

## 2017-07-15 DIAGNOSIS — R739 Hyperglycemia, unspecified: Secondary | ICD-10-CM | POA: Diagnosis not present

## 2017-07-15 DIAGNOSIS — I10 Essential (primary) hypertension: Secondary | ICD-10-CM | POA: Diagnosis not present

## 2017-07-15 MED ORDER — ALBUTEROL SULFATE HFA 108 (90 BASE) MCG/ACT IN AERS: 2.0000 | INHALATION_SPRAY | Freq: Four times a day (QID) | RESPIRATORY_TRACT | 11 refills | Status: DC | PRN

## 2017-07-15 MED ORDER — METHYLPREDNISOLONE ACETATE 80 MG/ML IJ SUSP
80.0000 mg | Freq: Once | INTRAMUSCULAR | Status: AC
  Administered 2017-07-15: 80 mg via INTRAMUSCULAR

## 2017-07-15 MED ORDER — ALBUTEROL SULFATE (2.5 MG/3ML) 0.083% IN NEBU: 2.5000 mg | INHALATION_SOLUTION | Freq: Four times a day (QID) | RESPIRATORY_TRACT | 12 refills | Status: DC | PRN

## 2017-07-15 MED ORDER — PREDNISONE 10 MG PO TABS: ORAL_TABLET | ORAL | 0 refills | Status: DC

## 2017-07-15 MED ORDER — GUAIFENESIN-CODEINE 100-10 MG/5ML PO SYRP: 5.0000 mL | ORAL_SOLUTION | Freq: Three times a day (TID) | ORAL | 0 refills | Status: AC | PRN

## 2017-07-15 MED ORDER — FLUTICASONE-SALMETEROL 250-50 MCG/DOSE IN AEPB: INHALATION_SPRAY | RESPIRATORY_TRACT | 5 refills | Status: DC

## 2017-07-15 NOTE — Patient Instructions (Addendum)
You had the steroid shot today  Please take all new medication as prescribed - the prednisone, cough medicine as needed  Please continue all other medications as before, and refills have been done if requested.- the inhaler and nebulizers  Please have the pharmacy call with any other refills you may need.  Please keep your appointments with your specialists as you may have planned  Please call for chest xray to be done if not improving in 2-3 days

## 2017-07-15 NOTE — Assessment & Plan Note (Signed)
stable overall by history and exam, recent data reviewed with pt, and pt to continue medical treatment as before,  to f/u any worsening symptoms or concerns Lab Results  Component Value Date   HGBA1C 5.9 02/13/2017

## 2017-07-15 NOTE — Progress Notes (Signed)
   Subjective:    Patient ID: Desiree SantosLoJuanna Eakins, female    DOB: 02/09/1935, 82 y.o.   MRN: [id number]  HPI  Here with son in law, c/o persistent congested cough whitish sputum with inability to sleep well, sob/doe and mild wheezing, and Pt denies chest pain, orthopnea, PND, increased LE swelling, palpitations, dizziness or syncope.  Was seen recently with short course low dose prednisone, but only slight improvement, then worse again.   Pt denies fever, wt loss, night sweats, loss of appetite, or other constitutional symptoms   Past Medical History:  Diagnosis Date  . Anxiety 02/13/2017  . Asthma   . B12 deficiency anemia 02/13/2017  . Chronic back pain 02/13/2017  . Chronic cough 02/13/2017  . COPD (chronic obstructive pulmonary disease) (HCC)   . Depression 02/13/2017  . Diverticulosis 02/13/2017  . Former smoker 2001  . GERD (gastroesophageal reflux disease) 02/13/2017  . HLD (hyperlipidemia) 02/13/2017  . HTN (hypertension) 02/13/2017  . Lumbar compression fracture (HCC)   . Osteopenia 02/13/2017  . Spinal stenosis 02/13/2017   Past Surgical History:  Procedure Laterality Date  . APPENDECTOMY    . CATARACT EXTRACTION, BILATERAL    . KYPHOPLASTY  2010  . left hip surgury    . right hip surgury      reports that she has quit smoking. She has never used smokeless tobacco. She reports that she drinks alcohol. She reports that she does not use drugs. family history includes Depression in her father; Lung cancer in her brother and sister; Mesothelioma in her son. Allergies  Allergen Reactions  . Levaquin [Levofloxacin] Hives  . Percocet [Oxycodone-Acetaminophen] Hives   Review of Systems  Constitutional: Negative for other unusual diaphoresis or sweats HENT: Negative for ear discharge or swelling Eyes: Negative for other worsening visual disturbances Respiratory: Negative for stridor or other swelling  Gastrointestinal: Negative for worsening distension or other blood Genitourinary:  Negative for retention or other urinary change Musculoskeletal: Negative for other MSK pain or swelling Skin: Negative for color change or other new lesions Neurological: Negative for worsening tremors and other numbness  Psychiatric/Behavioral: Negative for worsening agitation or other fatigue All other system neg per pt    Objective:   Physical Exam BP 120/74 (BP Location: Left Arm, Patient Position: Sitting, Cuff Size: Normal)   Pulse 81   Temp 98.4 F (36.9 C) (Oral)   Ht 5\' 2"  (1.575 m)   Wt 144 lb (65.3 kg)   SpO2 97%   BMI 26.34 kg/m  VS noted, not ill appaering Constitutional: Pt appears in NAD HENT: Head: NCAT.  Right Ear: External ear normal.  Left Ear: External ear normal.  Bilat tm's with mild erythema.  Max sinus areas non tender.  Pharynx with mild erythema, no exudate Eyes: . Pupils are equal, round, and reactive to light. Conjunctivae and EOM are normal Nose: without d/c or deformity Neck: Neck supple. Gross normal ROM Cardiovascular: Normal rate and regular rhythm.   Pulmonary/Chest: Effort normal and breath sounds decreased without rales but with mild scattered wheezing.  Neurological: Pt is alert. At baseline orientation, motor grossly intact Skin: Skin is warm. No rashes, other new lesions, no LE edema Psychiatric: Pt behavior is normal without agitation  All other system neg per pt      Assessment & Plan:

## 2017-07-15 NOTE — Assessment & Plan Note (Signed)
Mild to mod, for predpac asd, cough med prn so can sleep, declines cxr for now, cont inhalers and neb asd,  to f/u any worsening symptoms or concerns

## 2017-07-15 NOTE — Assessment & Plan Note (Signed)
stable overall by history and exam, recent data reviewed with pt, and pt to continue medical treatment as before,  to f/u any worsening symptoms or concerns BP Readings from Last 3 Encounters:  07/15/17 120/74  07/04/17 (!) 142/76  02/13/17 (!) 150/90

## 2017-07-16 ENCOUNTER — Telehealth: Payer: Self-pay | Admitting: Internal Medicine

## 2017-07-16 NOTE — Telephone Encounter (Signed)
Copied from CRM [id number]. Topic: Quick Communication - See Telephone Encounter >> Jul 16, 2017  4:54 PM Floria RavelingStovall, Shana A wrote: CRM for notification. See Telephone encounter for: 07/16/17.  Pharmacy called in and stated there where no instruction on how pt should be taking the Fluticasone-Salmeterol (ADVAIR DISKUS) 250-50 MCG/DOSE AEPB [[id number].  Please advise  [phone]- [facility]

## 2017-07-17 MED ORDER — FLUTICASONE-SALMETEROL 250-50 MCG/DOSE IN AEPB: INHALATION_SPRAY | RESPIRATORY_TRACT | 5 refills | Status: DC

## 2017-07-17 NOTE — Telephone Encounter (Signed)
rx re-done

## 2017-07-17 NOTE — Addendum Note (Signed)
Addended by: Corwin LevinsJOHN, Laberta Wilbon W on: 07/17/2017 12:31 PM   Modules accepted: Orders

## 2017-12-25 ENCOUNTER — Ambulatory Visit (INDEPENDENT_AMBULATORY_CARE_PROVIDER_SITE_OTHER): Payer: Medicare Other | Admitting: Internal Medicine

## 2017-12-25 ENCOUNTER — Encounter: Payer: Self-pay | Admitting: Internal Medicine

## 2017-12-25 ENCOUNTER — Other Ambulatory Visit: Payer: Medicare Other

## 2017-12-25 ENCOUNTER — Other Ambulatory Visit (INDEPENDENT_AMBULATORY_CARE_PROVIDER_SITE_OTHER): Payer: Medicare Other

## 2017-12-25 VITALS — BP 144/82 | HR 99 | Temp 98.1°F | Ht 62.0 in | Wt 145.0 lb

## 2017-12-25 DIAGNOSIS — F419 Anxiety disorder, unspecified: Secondary | ICD-10-CM

## 2017-12-25 DIAGNOSIS — J309 Allergic rhinitis, unspecified: Secondary | ICD-10-CM | POA: Diagnosis not present

## 2017-12-25 DIAGNOSIS — R739 Hyperglycemia, unspecified: Secondary | ICD-10-CM

## 2017-12-25 DIAGNOSIS — E785 Hyperlipidemia, unspecified: Secondary | ICD-10-CM

## 2017-12-25 DIAGNOSIS — I10 Essential (primary) hypertension: Secondary | ICD-10-CM | POA: Diagnosis not present

## 2017-12-25 LAB — URINALYSIS, ROUTINE W REFLEX MICROSCOPIC
Bilirubin Urine: NEGATIVE
Hgb urine dipstick: NEGATIVE
Ketones, ur: NEGATIVE
Leukocytes, UA: NEGATIVE
Nitrite: NEGATIVE
RBC / HPF: NONE SEEN (ref 0–?)
Specific Gravity, Urine: 1.01 (ref 1.000–1.030)
Total Protein, Urine: NEGATIVE
URINE GLUCOSE: NEGATIVE
UROBILINOGEN UA: 0.2 (ref 0.0–1.0)
WBC, UA: NONE SEEN (ref 0–?)
pH: 6.5 (ref 5.0–8.0)

## 2017-12-25 LAB — HEMOGLOBIN A1C: Hgb A1c MFr Bld: 6 % (ref 4.6–6.5)

## 2017-12-25 LAB — LIPID PANEL
Cholesterol: 196 mg/dL (ref 0–200)
HDL: 71.1 mg/dL (ref >39.00)
LDL Cholesterol: 105 mg/dL — ABNORMAL HIGH (ref 0–99)
NonHDL: 124.52
Total CHOL/HDL Ratio: 3
Triglycerides: 100 mg/dL (ref 0.0–149.0)
VLDL: 20 mg/dL (ref 0.0–40.0)

## 2017-12-25 LAB — TSH: TSH: 2.45 u[IU]/mL (ref 0.35–4.50)

## 2017-12-25 LAB — CBC WITH DIFFERENTIAL/PLATELET
Basophils Absolute: 0.1 10*3/uL (ref 0.0–0.1)
Basophils Relative: 1.2 % (ref 0.0–3.0)
EOS PCT: 2.2 % (ref 0.0–5.0)
Eosinophils Absolute: 0.2 10*3/uL (ref 0.0–0.7)
HCT: 38.8 % (ref 36.0–46.0)
Hemoglobin: 12.9 g/dL (ref 12.0–15.0)
LYMPHS ABS: 2.1 10*3/uL (ref 0.7–4.0)
Lymphocytes Relative: 27.9 % (ref 12.0–46.0)
MCHC: 33.1 g/dL (ref 30.0–36.0)
MCV: 85.1 fl (ref 78.0–100.0)
MONO ABS: 0.7 10*3/uL (ref 0.1–1.0)
Monocytes Relative: 8.9 % (ref 3.0–12.0)
Neutro Abs: 4.6 10*3/uL (ref 1.4–7.7)
Neutrophils Relative %: 59.8 % (ref 43.0–77.0)
Platelets: 357 10*3/uL (ref 150.0–400.0)
RBC: 4.56 Mil/uL (ref 3.87–5.11)
RDW: 13.8 % (ref 11.5–15.5)
WBC: 7.6 10*3/uL (ref 4.0–10.5)

## 2017-12-25 LAB — HEPATIC FUNCTION PANEL
ALT: 17 U/L (ref 0–35)
AST: 20 U/L (ref 0–37)
Albumin: 4.3 g/dL (ref 3.5–5.2)
Alkaline Phosphatase: 55 U/L (ref 39–117)
BILIRUBIN TOTAL: 0.5 mg/dL (ref 0.2–1.2)
Bilirubin, Direct: 0.1 mg/dL (ref 0.0–0.3)
Total Protein: 7.4 g/dL (ref 6.0–8.3)

## 2017-12-25 LAB — BASIC METABOLIC PANEL
BUN: 18 mg/dL (ref 6–23)
CALCIUM: 9.5 mg/dL (ref 8.4–10.5)
CO2: 28 mEq/L (ref 19–32)
Chloride: 101 mEq/L (ref 96–112)
Creatinine, Ser: 1 mg/dL (ref 0.40–1.20)
GFR: 56.62 mL/min — ABNORMAL LOW (ref >60.00)
Glucose, Bld: 98 mg/dL (ref 70–99)
Potassium: 3.4 mEq/L — ABNORMAL LOW (ref 3.5–5.1)
SODIUM: 141 meq/L (ref 135–145)

## 2017-12-25 MED ORDER — CITALOPRAM HYDROBROMIDE 20 MG PO TABS: 20.0000 mg | ORAL_TABLET | Freq: Every day | ORAL | 3 refills | Status: DC

## 2017-12-25 MED ORDER — METHYLPREDNISOLONE ACETATE 80 MG/ML IJ SUSP
80.0000 mg | Freq: Once | INTRAMUSCULAR | Status: AC
  Administered 2017-12-25: 80 mg via INTRAMUSCULAR

## 2017-12-25 NOTE — Patient Instructions (Addendum)
You had the steroid shot today  OK to take OTC zyrtec and nasacort as well for the allergies  Please take all new medication as prescribed - the celexa 20 mg per day (and ok to stop the bupropion)  Please continue all other medications as before, and refills have been done if requested.  Please have the pharmacy call with any other refills you may need.  Please continue your efforts at being more active, low cholesterol diet, and weight control.  You are otherwise up to date with prevention measures today.  Please keep your appointments with your specialists as you may have planned  Please go to the LAB in the Basement (turn left off the elevator) for the tests to be done today  You will be contacted by phone if any changes need to be made immediately.  Otherwise, you will receive a letter about your results with an explanation, but please check with MyChart first.  Please remember to sign up for MyChart if you have not done so, as this will be important to you in the future with finding out test results, communicating by private email, and scheduling acute appointments online when needed.  Please return in 6 months, or sooner if needed=

## 2017-12-25 NOTE — Progress Notes (Signed)
Subjective:    Patient ID: Desiree [name], female    DOB: [birth year], 82 y.o.   MRN: [id number]  HPI  Here with 3 months ongoing allergy symptoms with primary issue of persistent mod to severe nasal congestion with post nasal gtt and cough with choke and vomit one time; Does have clearish congestion, itch and sneezing, without fever, pain, ST, cough, swelling or wheezing.   Pt denies fever, wt loss, night sweats, loss of appetite, or other constitutional symptoms  Pt denies chest pain, increased sob or doe, wheezing, orthopnea, PND, palpitations, dizziness or syncope, though is bothered by mild bilat pedal edema in last few wks, has been intermittent for some time and the compression stockings are too hard to put on  Pt denies new neurological symptoms such as new headache, or facial or extremity weakness or numbness   Pt denies polydipsia, polyuria.  Also son and sister in law both passed very recently and grieving, maybe even more depressed and tearful without SI or HI, asks for change of welbutrin.   Past Medical History:  Diagnosis Date  . Anxiety 02/13/2017  . Asthma   . B12 deficiency anemia 02/13/2017  . Chronic back pain 02/13/2017  . Chronic cough 02/13/2017  . COPD (chronic obstructive pulmonary disease) (HCC)   . Depression 02/13/2017  . Diverticulosis 02/13/2017  . Former smoker 2001  . GERD (gastroesophageal reflux disease) 02/13/2017  . HLD (hyperlipidemia) 02/13/2017  . HTN (hypertension) 02/13/2017  . Lumbar compression fracture (HCC)   . Osteopenia 02/13/2017  . Spinal stenosis 02/13/2017   Past Surgical History:  Procedure Laterality Date  . APPENDECTOMY    . CATARACT EXTRACTION, BILATERAL    . KYPHOPLASTY  2010  . left hip surgury    . right hip surgury      reports that she has quit smoking. She has never used smokeless tobacco. She reports that she drinks alcohol. She reports that she does not use drugs. family history includes Depression in her father; Lung cancer in her  brother and sister; Mesothelioma in her son. Allergies  Allergen Reactions  . Levaquin [Levofloxacin] Hives  . Percocet [Oxycodone-Acetaminophen] Hives   Current Outpatient Medications on File Prior to Visit  Medication Sig Dispense Refill  . albuterol (PROVENTIL HFA;VENTOLIN HFA) 108 (90 Base) MCG/ACT inhaler Inhale 2 puffs into the lungs every 6 (six) hours as needed for wheezing or shortness of breath. 18 g 11  . albuterol (PROVENTIL) (2.5 MG/3ML) 0.083% nebulizer solution Take 3 mLs (2.5 mg total) by nebulization every 6 (six) hours as needed for wheezing or shortness of breath. 75 mL 12  . CYANOCOBALAMIN PO Take 1 tablet by mouth at bedtime.    . Fluticasone-Salmeterol (ADVAIR DISKUS) 250-50 MCG/DOSE AEPB Advair Diskus 250 mcg-50 mcg/dose powder for inhalation twice per day 60 each 5  . HYDROCHLOROTHIAZIDE PO Take 1 tablet by mouth daily.    Marland Kitchen losartan (COZAAR) 100 MG tablet Take 1 tablet (100 mg total) by mouth daily. 90 tablet 3  . montelukast (SINGULAIR) 10 MG tablet Take 10 mg by mouth at bedtime.    . predniSONE (DELTASONE) 10 MG tablet 3 tabs by mouth per day for 3 days,2tabs per day for 3 days,1tab per day for 3 days 18 tablet 0   No current facility-administered medications on file prior to visit.    Review of Systems  Constitutional: Negative for other unusual diaphoresis or sweats HENT: Negative for ear discharge or swelling Eyes: Negative for other worsening visual disturbances Respiratory:  Negative for stridor or other swelling  Gastrointestinal: Negative for worsening distension or other blood Genitourinary: Negative for retention or other urinary change Musculoskeletal: Negative for other MSK pain or swelling Skin: Negative for color change or other new lesions Neurological: Negative for worsening tremors and other numbness  Psychiatric/Behavioral: Negative for worsening agitation or other fatigue All other system neg per pt    Objective:   Physical Exam BP (!)  144/82   Pulse 99   Temp 98.1 F (36.7 C) (Oral)   Ht 5\' 2"  (1.575 m)   Wt 145 lb (65.8 kg)   SpO2 94%   BMI 26.52 kg/m  VS noted, not ill appearing Constitutional: Pt appears in NAD HENT: Head: NCAT.  Right Ear: External ear normal.  Left Ear: External ear normal.  Eyes: . Pupils are equal, round, and reactive to light. Conjunctivae and EOM are normal Bilat tm's with mild erythema.  Max sinus areas non tender.  Pharynx with mild erythema, no exudate  Nose: without d/c or deformity Neck: Neck supple. Gross normal ROM Cardiovascular: Normal rate and regular rhythm.   Pulmonary/Chest: Effort normal and breath sounds without rales or wheezing.  Neurological: Pt is alert. At baseline orientation, motor grossly intact Skin: Skin is warm. No rashes, other new lesions, trace pedal edema bilat pedal  Psychiatric: Pt behavior is normal without agitation , + depressed tearful affect and mood Lab Results  Component Value Date   WBC 7.6 12/25/2017   HGB 12.9 12/25/2017   HCT 38.8 12/25/2017   PLT 357.0 12/25/2017   GLUCOSE 98 12/25/2017   CHOL 196 12/25/2017   TRIG 100.0 12/25/2017   HDL 71.10 12/25/2017   LDLCALC 105 (H) 12/25/2017   ALT 17 12/25/2017   AST 20 12/25/2017   NA 141 12/25/2017   K 3.4 (L) 12/25/2017   CL 101 12/25/2017   CREATININE 1.00 12/25/2017   BUN 18 12/25/2017   CO2 28 12/25/2017   TSH 2.45 12/25/2017   HGBA1C 6.0 12/25/2017      Assessment & Plan:

## 2017-12-26 ENCOUNTER — Encounter: Payer: Self-pay | Admitting: Internal Medicine

## 2017-12-26 NOTE — Assessment & Plan Note (Signed)
Mild to mod situational worsening, for change wellbutrin to celexa 20 qd,  to f/u any worsening symptoms or concerns

## 2017-12-26 NOTE — Assessment & Plan Note (Signed)
Mild to mod, for depomedrol IM 80, otc zyrtec prn,  to f/u any worsening symptoms or concerns 

## 2017-12-26 NOTE — Assessment & Plan Note (Signed)
stable overall by history and exam, recent data reviewed with pt, and pt to continue medical treatment as before,  to f/u any worsening symptoms or concerns  

## 2018-02-21 DIAGNOSIS — J40 Bronchitis, not specified as acute or chronic: Secondary | ICD-10-CM | POA: Diagnosis not present

## 2018-02-21 DIAGNOSIS — R05 Cough: Secondary | ICD-10-CM | POA: Diagnosis not present

## 2018-02-23 ENCOUNTER — Ambulatory Visit: Payer: Medicare Other | Admitting: Internal Medicine

## 2018-02-26 ENCOUNTER — Ambulatory Visit (INDEPENDENT_AMBULATORY_CARE_PROVIDER_SITE_OTHER): Payer: Medicare Other | Admitting: Internal Medicine

## 2018-02-26 ENCOUNTER — Ambulatory Visit (INDEPENDENT_AMBULATORY_CARE_PROVIDER_SITE_OTHER)
Admission: RE | Admit: 2018-02-26 | Discharge: 2018-02-26 | Disposition: A | Discharge status: Home / Self Care | Payer: Medicare Other | Source: Ambulatory Visit | Attending: Internal Medicine | Admitting: Internal Medicine

## 2018-02-26 ENCOUNTER — Encounter: Payer: Self-pay | Admitting: Internal Medicine

## 2018-02-26 VITALS — BP 116/62 | HR 78 | Temp 97.7°F | Ht 62.0 in | Wt 144.0 lb

## 2018-02-26 DIAGNOSIS — R739 Hyperglycemia, unspecified: Secondary | ICD-10-CM

## 2018-02-26 DIAGNOSIS — J449 Chronic obstructive pulmonary disease, unspecified: Secondary | ICD-10-CM

## 2018-02-26 DIAGNOSIS — R05 Cough: Secondary | ICD-10-CM

## 2018-02-26 DIAGNOSIS — R062 Wheezing: Secondary | ICD-10-CM

## 2018-02-26 DIAGNOSIS — I10 Essential (primary) hypertension: Secondary | ICD-10-CM | POA: Diagnosis not present

## 2018-02-26 DIAGNOSIS — R059 Cough, unspecified: Secondary | ICD-10-CM | POA: Insufficient documentation

## 2018-02-26 MED ORDER — AZITHROMYCIN 250 MG PO TABS: ORAL_TABLET | ORAL | 1 refills | Status: DC

## 2018-02-26 MED ORDER — HYDROCODONE-HOMATROPINE 5-1.5 MG/5ML PO SYRP: 5.0000 mL | ORAL_SOLUTION | Freq: Four times a day (QID) | ORAL | 0 refills | Status: AC | PRN

## 2018-02-26 NOTE — Assessment & Plan Note (Addendum)
With mild exacerbation as above, o/w stable overall by history and exam, recent data reviewed with pt, and pt to continue medical treatment as before,  to f/u any worsening symptoms or concerns,refer pulm per pt reqeust

## 2018-02-26 NOTE — Assessment & Plan Note (Signed)
Stable

## 2018-02-26 NOTE — Patient Instructions (Addendum)
Please take all new medication as prescribed - the zpack antibiotic, and cough medicine if needed  You can also take OTC Mucinex (or it's generic off brand) for congestion, and tylenol as needed for pain.  Please continue all other medications as before, including finishing the medrol steroid pill  Please have the pharmacy call with any other refills you may need.  You will be contacted regarding the referral for: Pulmonary  Please keep your appointments with your specialists as you may have planned  Please go to the XRAY Department in the Basement (go straight as you get off the elevator) for the x-ray testing  You will be contacted by phone if any changes need to be made immediately.  Otherwise, you will receive a letter about your results with an explanation, but please check with MyChart first.  Please remember to sign up for MyChart if you have not done so, as this will be important to you in the future with finding out test results, communicating by private email, and scheduling acute appointments online when needed.

## 2018-02-26 NOTE — Assessment & Plan Note (Signed)
Mild to mod, c/w bronchitis vs pna, for f/u cxr, for antibx course, cough med prn, to f/u any worsening symptoms or concerns

## 2018-02-26 NOTE — Assessment & Plan Note (Signed)
/  Mild to mod, for predpac asd,  to f/u any worsening symptoms or concerns 

## 2018-02-26 NOTE — Assessment & Plan Note (Signed)
stable overall by history and exam, recent data reviewed with pt, and pt to continue medical treatment as before,  to f/u any worsening symptoms or concerns  

## 2018-02-26 NOTE — Progress Notes (Signed)
Subjective:    Patient ID: Desiree [name], female    DOB: 09-Feb-1935, 83 y.o.   MRN: [id number]  HPI  Here after went to UC x 5 days ago, seen for cough, multiple colors, some wheeziness but not really sob, no fever, severe ST and ear pain, and no CP, diaphroesis.  Had CXR c/w possible bronchitis (or at least dx with that, and concerned about pna); tx with doxycline, medrol pack.  To her seemed to help the throat, but no really the cough, congestion, and wheeziness.  Still with prod cough, No blood involved. Not dizzy, overaly weak and not falls, just moving more slowly. Pt denies new neurological symptoms such as new headache, or facial or extremity weakness or numbness   Pt denies polydipsia, polyuria Past Medical History:  Diagnosis Date  . Anxiety 02/13/2017  . Asthma   . B12 deficiency anemia 02/13/2017  . Chronic back pain 02/13/2017  . Chronic cough 02/13/2017  . COPD (chronic obstructive pulmonary disease) (HCC)   . Depression 02/13/2017  . Diverticulosis 02/13/2017  . Former smoker 2001  . GERD (gastroesophageal reflux disease) 02/13/2017  . HLD (hyperlipidemia) 02/13/2017  . HTN (hypertension) 02/13/2017  . Lumbar compression fracture (HCC)   . Osteopenia 02/13/2017  . Spinal stenosis 02/13/2017   Past Surgical History:  Procedure Laterality Date  . APPENDECTOMY    . CATARACT EXTRACTION, BILATERAL    . KYPHOPLASTY  2010  . left hip surgury    . right hip surgury      reports that she has quit smoking. She has never used smokeless tobacco. She reports current alcohol use. She reports that she does not use drugs. family history includes Depression in her father; Lung cancer in her brother and sister; Mesothelioma in her son. Allergies  Allergen Reactions  . Levaquin [Levofloxacin] Hives  . Percocet [Oxycodone-Acetaminophen] Hives   Current Outpatient Medications on File Prior to Visit  Medication Sig Dispense Refill  . albuterol (PROVENTIL HFA;VENTOLIN HFA) 108 (90 Base) MCG/ACT  inhaler Inhale 2 puffs into the lungs every 6 (six) hours as needed for wheezing or shortness of breath. 18 g 11  . albuterol (PROVENTIL) (2.5 MG/3ML) 0.083% nebulizer solution Take 3 mLs (2.5 mg total) by nebulization every 6 (six) hours as needed for wheezing or shortness of breath. 75 mL 12  . citalopram (CELEXA) 20 MG tablet Take 1 tablet (20 mg total) by mouth daily. 90 tablet 3  . CYANOCOBALAMIN PO Take 1 tablet by mouth at bedtime.    . Fluticasone-Salmeterol (ADVAIR DISKUS) 250-50 MCG/DOSE AEPB Advair Diskus 250 mcg-50 mcg/dose powder for inhalation twice per day 60 each 5  . HYDROCHLOROTHIAZIDE PO Take 1 tablet by mouth daily.    Marland Kitchen losartan (COZAAR) 100 MG tablet Take 1 tablet (100 mg total) by mouth daily. 90 tablet 3  . montelukast (SINGULAIR) 10 MG tablet Take 10 mg by mouth at bedtime.    . predniSONE (DELTASONE) 10 MG tablet 3 tabs by mouth per day for 3 days,2tabs per day for 3 days,1tab per day for 3 days 18 tablet 0   No current facility-administered medications on file prior to visit.    Review of Systems  Constitutional: Negative for other unusual diaphoresis or sweats HENT: Negative for ear discharge or swelling Eyes: Negative for other worsening visual disturbances Respiratory: Negative for stridor or other swelling  Gastrointestinal: Negative for worsening distension or other blood Genitourinary: Negative for retention or other urinary change Musculoskeletal: Negative for other MSK pain or  swelling Skin: Negative for color change or other new lesions Neurological: Negative for worsening tremors and other numbness  Psychiatric/Behavioral: Negative for worsening agitation or other fatigue All other system neg per pt    Objective:   Physical Exam BP 116/62   Pulse 78   Temp 97.7 F (36.5 C) (Oral)   Ht 5\' 2"  (1.575 m)   Wt 144 lb (65.3 kg)   SpO2 93%   BMI 26.34 kg/m  VS noted, mild ill Constitutional: Pt appears in NAD HENT: Head: NCAT.  Right Ear: External  ear normal.  Left Ear: External ear normal.  Bilat tm's with mild erythema.  Max sinus areas non tender.  Pharynx with mild erythema, no exudate Eyes: . Pupils are equal, round, and reactive to light. Conjunctivae and EOM are normal Nose: without d/c or deformity Neck: Neck supple. Gross normal ROM Cardiovascular: Normal rate and regular rhythm.   Pulmonary/Chest: Effort normal and breath sounds decreased without rales but with few bilat wheezing.  Neurological: Pt is alert. At baseline orientation, motor grossly intact Skin: Skin is warm. No rashes, other new lesions, no LE edema Psychiatric: Pt behavior is normal without agitation  No other exam findings Lab Results  Component Value Date   WBC 7.6 12/25/2017   HGB 12.9 12/25/2017   HCT 38.8 12/25/2017   PLT 357.0 12/25/2017   GLUCOSE 98 12/25/2017   CHOL 196 12/25/2017   TRIG 100.0 12/25/2017   HDL 71.10 12/25/2017   LDLCALC 105 (H) 12/25/2017   ALT 17 12/25/2017   AST 20 12/25/2017   NA 141 12/25/2017   K 3.4 (L) 12/25/2017   CL 101 12/25/2017   CREATININE 1.00 12/25/2017   BUN 18 12/25/2017   CO2 28 12/25/2017   TSH 2.45 12/25/2017   HGBA1C 6.0 12/25/2017       Assessment & Plan:

## 2018-02-28 ENCOUNTER — Encounter: Payer: Self-pay | Admitting: Internal Medicine

## 2018-03-03 ENCOUNTER — Ambulatory Visit: Payer: Self-pay

## 2018-03-03 NOTE — Telephone Encounter (Signed)
Pt daughter called to say that her mother broke out in a rash from head to toe Sunday after taking Azithromycin RX for acute bronchitis Dx. She started the medication on Friday after her appointment. She has stopped the Azithromycin and took benadryl for the itching and redness.  All symptoms have gone. She is requesting a different antibiotic. Daughter states that her mother is still coughing.  Call place to office [facility].. Will route request to office.  Answer Assessment - Initial Assessment Questions 1. APPEARANCE of RASH: "Describe the rash." (e.g., spots, blisters, raised areas, skin peeling, scaly)     Itching head to toe 2. SIZE: "How big are the spots?" (e.g., tip of pen, eraser, coin; inches, centimeters)     Not sure 3. LOCATION: "Where is the rash located?"     All over body 4. COLOR: "What color is the rash?" (Note: It is difficult to assess rash color in people with darker-colored skin. When this situation occurs, simply ask the caller to describe what they see.)     red 5. ONSET: "When did the rash begin?"     Sunday morning  6. FEVER: "Do you have a fever?" If so, ask: "What is your temperature, how was it measured, and when did it start?"     no 7. ITCHING: "Does the rash itch?" If so, ask: "How bad is the itch?" (Scale 1-10; or mild, moderate, severe)   10 8. CAUSE: "What do you think is causing the rash?"     Azithromycin pt spoke to pharmicist 9. NEW MEDICATION: "What new medication are you taking?" (e.g., name of antibiotic) "When did you start taking this medication?".     Azithromycin 10. OTHER SYMPTOMS: "Do you have any other symptoms?" (e.g., sore throat, fever, joint pain)       no 11. PREGNANCY: "Is there any chance you are pregnant?" "When was your last menstrual period?"       N/A  Protocols used: RASH - WIDESPREAD ON DRUGS-A-AH

## 2018-03-04 ENCOUNTER — Encounter: Payer: Self-pay | Admitting: Internal Medicine

## 2018-03-04 MED ORDER — CEPHALEXIN 500 MG PO CAPS: 500.0000 mg | ORAL_CAPSULE | Freq: Three times a day (TID) | ORAL | 0 refills | Status: AC

## 2018-03-04 NOTE — Telephone Encounter (Signed)
Change in antibiotic done erx

## 2018-03-04 NOTE — Telephone Encounter (Signed)
Done erx 

## 2018-03-04 NOTE — Telephone Encounter (Signed)
Called pt/daughter no answer LMOM MD sent new antibiotic to [facility].Marland KitchenRaechel Chute

## 2018-03-04 NOTE — Addendum Note (Signed)
Addended by: Corwin Levins on: 03/04/2018 12:26 PM   Modules accepted: Orders

## 2018-04-21 ENCOUNTER — Institutional Professional Consult (permissible substitution): Payer: Medicare Other | Admitting: Internal Medicine

## 2018-06-29 ENCOUNTER — Ambulatory Visit: Payer: Medicare Other | Admitting: Internal Medicine

## 2018-07-02 ENCOUNTER — Other Ambulatory Visit: Payer: Self-pay

## 2018-07-02 ENCOUNTER — Ambulatory Visit (INDEPENDENT_AMBULATORY_CARE_PROVIDER_SITE_OTHER): Payer: Medicare Other | Admitting: Internal Medicine

## 2018-07-02 ENCOUNTER — Encounter: Payer: Self-pay | Admitting: Internal Medicine

## 2018-07-02 ENCOUNTER — Other Ambulatory Visit (INDEPENDENT_AMBULATORY_CARE_PROVIDER_SITE_OTHER): Payer: Medicare Other

## 2018-07-02 VITALS — BP 116/68 | HR 85 | Temp 98.2°F | Ht 62.0 in | Wt 148.0 lb

## 2018-07-02 DIAGNOSIS — E538 Deficiency of other specified B group vitamins: Secondary | ICD-10-CM | POA: Diagnosis not present

## 2018-07-02 DIAGNOSIS — R739 Hyperglycemia, unspecified: Secondary | ICD-10-CM | POA: Diagnosis not present

## 2018-07-02 DIAGNOSIS — G4700 Insomnia, unspecified: Secondary | ICD-10-CM | POA: Diagnosis not present

## 2018-07-02 DIAGNOSIS — E611 Iron deficiency: Secondary | ICD-10-CM | POA: Diagnosis not present

## 2018-07-02 DIAGNOSIS — E785 Hyperlipidemia, unspecified: Secondary | ICD-10-CM | POA: Diagnosis not present

## 2018-07-02 DIAGNOSIS — D519 Vitamin B12 deficiency anemia, unspecified: Secondary | ICD-10-CM

## 2018-07-02 DIAGNOSIS — K219 Gastro-esophageal reflux disease without esophagitis: Secondary | ICD-10-CM

## 2018-07-02 DIAGNOSIS — E559 Vitamin D deficiency, unspecified: Secondary | ICD-10-CM | POA: Diagnosis not present

## 2018-07-02 DIAGNOSIS — R05 Cough: Secondary | ICD-10-CM

## 2018-07-02 DIAGNOSIS — J45909 Unspecified asthma, uncomplicated: Secondary | ICD-10-CM | POA: Diagnosis not present

## 2018-07-02 DIAGNOSIS — R053 Chronic cough: Secondary | ICD-10-CM

## 2018-07-02 DIAGNOSIS — J449 Chronic obstructive pulmonary disease, unspecified: Secondary | ICD-10-CM | POA: Diagnosis not present

## 2018-07-02 LAB — IBC PANEL
Iron: 34 ug/dL — ABNORMAL LOW (ref 42–145)
Saturation Ratios: 7.8 % — ABNORMAL LOW (ref 20.0–50.0)
Transferrin: 310 mg/dL (ref 212.0–360.0)

## 2018-07-02 LAB — URINALYSIS, ROUTINE W REFLEX MICROSCOPIC
Bilirubin Urine: NEGATIVE
Hgb urine dipstick: NEGATIVE
Ketones, ur: NEGATIVE
Leukocytes,Ua: NEGATIVE
Nitrite: NEGATIVE
RBC / HPF: NONE SEEN (ref 0–?)
Specific Gravity, Urine: >=1.03 — AB (ref 1.000–1.030)
Total Protein, Urine: NEGATIVE
Urine Glucose: NEGATIVE
Urobilinogen, UA: 0.2 (ref 0.0–1.0)
WBC, UA: NONE SEEN (ref 0–?)
pH: 5.5 (ref 5.0–8.0)

## 2018-07-02 LAB — CBC WITH DIFFERENTIAL/PLATELET
Basophils Absolute: 0.1 10*3/uL (ref 0.0–0.1)
Basophils Relative: 1.3 % (ref 0.0–3.0)
Eosinophils Absolute: 0.3 10*3/uL (ref 0.0–0.7)
Eosinophils Relative: 3 % (ref 0.0–5.0)
HCT: 38.3 % (ref 36.0–46.0)
Hemoglobin: 12.7 g/dL (ref 12.0–15.0)
Lymphocytes Relative: 27.8 % (ref 12.0–46.0)
Lymphs Abs: 2.4 10*3/uL (ref 0.7–4.0)
MCHC: 33.3 g/dL (ref 30.0–36.0)
MCV: 85.7 fl (ref 78.0–100.0)
Monocytes Absolute: 0.9 10*3/uL (ref 0.1–1.0)
Monocytes Relative: 9.8 % (ref 3.0–12.0)
Neutro Abs: 5.1 10*3/uL (ref 1.4–7.7)
Neutrophils Relative %: 58.1 % (ref 43.0–77.0)
Platelets: 328 10*3/uL (ref 150.0–400.0)
RBC: 4.47 Mil/uL (ref 3.87–5.11)
RDW: 13.7 % (ref 11.5–15.5)
WBC: 8.7 10*3/uL (ref 4.0–10.5)

## 2018-07-02 LAB — HEPATIC FUNCTION PANEL
ALT: 12 U/L (ref 0–35)
AST: 15 U/L (ref 0–37)
Albumin: 4.3 g/dL (ref 3.5–5.2)
Alkaline Phosphatase: 57 U/L (ref 39–117)
Bilirubin, Direct: 0.1 mg/dL (ref 0.0–0.3)
Total Bilirubin: 0.4 mg/dL (ref 0.2–1.2)
Total Protein: 7.3 g/dL (ref 6.0–8.3)

## 2018-07-02 LAB — LIPID PANEL
Cholesterol: 201 mg/dL — ABNORMAL HIGH (ref 0–200)
HDL: 61.4 mg/dL (ref >39.00)
LDL Cholesterol: 115 mg/dL — ABNORMAL HIGH (ref 0–99)
NonHDL: 139.82
Total CHOL/HDL Ratio: 3
Triglycerides: 124 mg/dL (ref 0.0–149.0)
VLDL: 24.8 mg/dL (ref 0.0–40.0)

## 2018-07-02 LAB — BASIC METABOLIC PANEL
BUN: 21 mg/dL (ref 6–23)
CO2: 32 mEq/L (ref 19–32)
Calcium: 9.5 mg/dL (ref 8.4–10.5)
Chloride: 100 mEq/L (ref 96–112)
Creatinine, Ser: 1.09 mg/dL (ref 0.40–1.20)
GFR: 48.16 mL/min — ABNORMAL LOW (ref >60.00)
Glucose, Bld: 97 mg/dL (ref 70–99)
Potassium: 3.5 mEq/L (ref 3.5–5.1)
Sodium: 140 mEq/L (ref 135–145)

## 2018-07-02 LAB — VITAMIN B12: Vitamin B-12: 445 pg/mL (ref 211–911)

## 2018-07-02 LAB — HEMOGLOBIN A1C: Hgb A1c MFr Bld: 6.2 % (ref 4.6–6.5)

## 2018-07-02 LAB — VITAMIN D 25 HYDROXY (VIT D DEFICIENCY, FRACTURES): VITD: 47.37 ng/mL (ref 30.00–100.00)

## 2018-07-02 LAB — TSH: TSH: 1.42 u[IU]/mL (ref 0.35–4.50)

## 2018-07-02 MED ORDER — PANTOPRAZOLE SODIUM 40 MG PO TBEC: 40.0000 mg | DELAYED_RELEASE_TABLET | Freq: Every day | ORAL | 3 refills | Status: DC

## 2018-07-02 MED ORDER — FLUTICASONE-SALMETEROL 500-50 MCG/DOSE IN AEPB: 1.0000 | INHALATION_SPRAY | Freq: Two times a day (BID) | RESPIRATORY_TRACT | 3 refills | Status: DC

## 2018-07-02 NOTE — Progress Notes (Signed)
Subjective:    Patient ID: Jerl SantosLoJuanna Mackowiak, female    DOB: 10/30/1935, 83 y.o.   MRN: [id number]  HPI   Here for yearly f/u;  Overall doing ok.  Pt denies neurological change such as new headache, facial or extremity weakness.  Pt denies polydipsia, polyuria, or low sugar symptoms. Pt states overall good compliance with treatment and medications, good tolerability, and has been trying to follow appropriate diet.  Pt denies worsening depressive symptoms, suicidal ideation or panic. No fever, night sweats, wt loss, loss of appetite, or other constitutional symptoms.  Pt states good ability with ADL's, has low fall risk, home safety reviewed and adequate, no other significant changes in hearing or vision, and only occasionally active with exercise. Pt denies chest pain, wheezing, orthopnea, PND, increased LE swelling, palpitations, dizziness or syncope, but has had a persistent dry cough with mild sob.doe over baseline that she notices mostly with climbing stairs even slowly.  Nothing seems to make better or worse. CXR neg for acute feb 2020 Also has had mild worsening reflux symptoms especially at night, without abd pain, dysphagia, n/v, bowel change or blood.   Also with worsening 2 months of difficulty getting to sleep most nights, with Denies worsening depressive symptoms, suicidal ideation, or panic.  Has trouble getting to sleep, and sometimes wakes at 3am.  Has not tried meds. Past Medical History:  Diagnosis Date  . Anxiety 02/13/2017  . Asthma   . B12 deficiency anemia 02/13/2017  . Chronic back pain 02/13/2017  . Chronic cough 02/13/2017  . COPD (chronic obstructive pulmonary disease) (HCC)   . Depression 02/13/2017  . Diverticulosis 02/13/2017  . Former smoker 2001  . GERD (gastroesophageal reflux disease) 02/13/2017  . HLD (hyperlipidemia) 02/13/2017  . HTN (hypertension) 02/13/2017  . Lumbar compression fracture (HCC)   . Osteopenia 02/13/2017  . Spinal stenosis 02/13/2017   Past Surgical  History:  Procedure Laterality Date  . APPENDECTOMY    . CATARACT EXTRACTION, BILATERAL    . KYPHOPLASTY  2010  . left hip surgury    . right hip surgury      reports that she has quit smoking. She has never used smokeless tobacco. She reports current alcohol use. She reports that she does not use drugs. family history includes Depression in her father; Lung cancer in her brother and sister; Mesothelioma in her son. Allergies  Allergen Reactions  . Levaquin [Levofloxacin] Hives  . Percocet [Oxycodone-Acetaminophen] Hives  . Azithromycin Rash   Current Outpatient Medications on File Prior to Visit  Medication Sig Dispense Refill  . albuterol (PROVENTIL HFA;VENTOLIN HFA) 108 (90 Base) MCG/ACT inhaler Inhale 2 puffs into the lungs every 6 (six) hours as needed for wheezing or shortness of breath. 18 g 11  . albuterol (PROVENTIL) (2.5 MG/3ML) 0.083% nebulizer solution Take 3 mLs (2.5 mg total) by nebulization every 6 (six) hours as needed for wheezing or shortness of breath. 75 mL 12  . azithromycin (ZITHROMAX Z-PAK) 250 MG tablet 2 tab by mouth day 1, then 1 per day 6 tablet 1  . citalopram (CELEXA) 20 MG tablet Take 1 tablet (20 mg total) by mouth daily. 90 tablet 3  . CYANOCOBALAMIN PO Take 1 tablet by mouth at bedtime.    Marland Kitchen. HYDROCHLOROTHIAZIDE PO Take 1 tablet by mouth daily.    Marland Kitchen. losartan (COZAAR) 100 MG tablet Take 1 tablet (100 mg total) by mouth daily. 90 tablet 3  . montelukast (SINGULAIR) 10 MG tablet Take 10 mg by mouth  at bedtime.    . predniSONE (DELTASONE) 10 MG tablet 3 tabs by mouth per day for 3 days,2tabs per day for 3 days,1tab per day for 3 days 18 tablet 0   No current facility-administered medications on file prior to visit.    Review of Systems Constitutional: Negative for other unusual diaphoresis, sweats, appetite or weight changes HENT: Negative for other worsening hearing loss, ear pain, facial swelling, mouth sores or neck stiffness.   Eyes: Negative for other  worsening pain, redness or other visual disturbance.  Respiratory: Negative for other stridor or swelling Cardiovascular: Negative for other palpitations or other chest pain  Gastrointestinal: Negative for worsening diarrhea or loose stools, blood in stool, distention or other pain Genitourinary: Negative for hematuria, flank pain or other change in urine volume.  Musculoskeletal: Negative for myalgias or other joint swelling.  Skin: Negative for other color change, or other wound or worsening drainage.  Neurological: Negative for other syncope or numbness. Hematological: Negative for other adenopathy or swelling Psychiatric/Behavioral: Negative for hallucinations, other worsening agitation, SI, self-injury, or new decreased concentration All other system neg per pt    Objective:   Physical Exam BP 116/68   Pulse 85   Temp 98.2 F (36.8 C) (Oral)   Ht 5\' 2"  (1.575 m)   Wt 148 lb (67.1 kg)   SpO2 92%   BMI 27.07 kg/m  VS noted, not ill appearing Constitutional: Pt is oriented to person, place, and time. Appears well-developed and well-nourished, in no significant distress and comfortable Head: Normocephalic and atraumatic  Eyes: Conjunctivae and EOM are normal. Pupils are equal, round, and reactive to light Right Ear: External ear normal without discharge Left Ear: External ear normal without discharge Nose: Nose without discharge or deformity Mouth/Throat: Oropharynx is without other ulcerations and moist  Neck: Normal range of motion. Neck supple. No JVD present. No tracheal deviation present or significant neck LA or mass Cardiovascular: Normal rate, regular rhythm, normal heart sounds and intact distal pulses.   Pulmonary/Chest: WOB normal and breath sounds decreased without rales or wheezing  Abdominal: Soft. Bowel sounds are normal. NT. No HSM  Musculoskeletal: Normal range of motion. Exhibits no edema Lymphadenopathy: Has no other cervical adenopathy.  Neurological: Pt is  alert and oriented to person, place, and time. Pt has normal reflexes. No cranial nerve deficit. Motor grossly intact, Gait intact Skin: Skin is warm and dry. No rash noted or new ulcerations Psychiatric:  Has normal mood and affect. Behavior is normal without agitation Lab Results  Component Value Date   WBC 8.7 07/02/2018   HGB 12.7 07/02/2018   HCT 38.3 07/02/2018   PLT 328.0 07/02/2018   GLUCOSE 97 07/02/2018   CHOL 201 (H) 07/02/2018   TRIG 124.0 07/02/2018   HDL 61.40 07/02/2018   LDLCALC 115 (H) 07/02/2018   ALT 12 07/02/2018   AST 15 07/02/2018   NA 140 07/02/2018   K 3.5 07/02/2018   CL 100 07/02/2018   CREATININE 1.09 07/02/2018   BUN 21 07/02/2018   CO2 32 07/02/2018   TSH 1.42 07/02/2018   HGBA1C 6.2 07/02/2018      Assessment & Plan:

## 2018-07-02 NOTE — Patient Instructions (Signed)
OK to increase the Advair to the 500/50 twice per day  OK to change the prilosec to protonix 40 mg per day  Ok to take the melatonin 10 mg at night    Please continue all other medications as before, and refills have been done if requested.  Please have the pharmacy call with any other refills you may need.  Please continue your efforts at being more active, low cholesterol diet, and weight control.  You are otherwise up to date with prevention measures today.  Please keep your appointments with your specialists as you may have planned  Please go to the LAB in the Basement (turn left off the elevator) for the tests to be done today  You will be contacted by phone if any changes need to be made immediately.  Otherwise, you will receive a letter about your results with an explanation, but please check with MyChart first.  Please remember to sign up for MyChart if you have not done so, as this will be important to you in the future with finding out test results, communicating by private email, and scheduling acute appointments online when needed.  Please return in 6 months, or sooner if needed

## 2018-07-03 ENCOUNTER — Encounter: Payer: Self-pay | Admitting: Internal Medicine

## 2018-07-03 ENCOUNTER — Other Ambulatory Visit: Payer: Self-pay | Admitting: Internal Medicine

## 2018-07-03 DIAGNOSIS — E611 Iron deficiency: Secondary | ICD-10-CM | POA: Insufficient documentation

## 2018-07-04 ENCOUNTER — Encounter: Payer: Self-pay | Admitting: Internal Medicine

## 2018-07-04 DIAGNOSIS — G4700 Insomnia, unspecified: Secondary | ICD-10-CM | POA: Insufficient documentation

## 2018-07-04 DIAGNOSIS — E538 Deficiency of other specified B group vitamins: Secondary | ICD-10-CM | POA: Insufficient documentation

## 2018-07-04 NOTE — Assessment & Plan Note (Signed)
Hopeful to improve with increased advair and start protonix

## 2018-07-04 NOTE — Assessment & Plan Note (Signed)
Ok for melatonin 10 mg qhs prn,  to f/u any worsening symptoms or concerns

## 2018-07-04 NOTE — Assessment & Plan Note (Signed)
Also for iron level with lab

## 2018-07-04 NOTE — Assessment & Plan Note (Addendum)
With persistent cough with hx of copd and asthma, ok for trial increased advair 500/50 bid, cont all other tx, consider pulm referral  Note:  Total time for pt hx, exam, review of record with pt in the room, determination of diagnoses and plan for further eval and tx is > 40 min, with over 50% spent in coordination and counseling of patient including the differential dx, tx, further evaluation and other management of copd, cough, asthma, gerd, iron deficiency, hyperglycemia, HLD, b12 deficiency, insomnia

## 2018-07-04 NOTE — Assessment & Plan Note (Signed)
For b12 level f/u

## 2018-07-04 NOTE — Assessment & Plan Note (Signed)
Uncontrolled, to change prilosec to protonix 40 qd, consider pepcid 20 qhs if not improved, consider GI referral

## 2018-07-04 NOTE — Assessment & Plan Note (Signed)
For increased advair as above

## 2018-07-04 NOTE — Assessment & Plan Note (Signed)
Asympt, stable overall by history and exam, recent data reviewed with pt, and pt to continue medical treatment as before,  to f/u any worsening symptoms or concerns, for a1c with labs

## 2018-07-04 NOTE — Assessment & Plan Note (Signed)
stable overall by history and exam, recent data reviewed with pt, and pt to continue medical treatment as before,  to f/u any worsening symptoms or concerns, for lipids with labs 

## 2018-07-05 ENCOUNTER — Other Ambulatory Visit: Payer: Self-pay | Admitting: Internal Medicine

## 2018-07-05 MED ORDER — POLYSACCHARIDE IRON COMPLEX 150 MG PO CAPS: 150.0000 mg | ORAL_CAPSULE | Freq: Every day | ORAL | 1 refills | Status: AC

## 2018-07-28 ENCOUNTER — Encounter (HOSPITAL_COMMUNITY): Payer: Self-pay

## 2018-07-28 ENCOUNTER — Emergency Department (HOSPITAL_COMMUNITY)
Admission: EM | Admit: 2018-07-28 | Discharge: 2018-07-28 | Disposition: A | Discharge status: Home / Self Care | Payer: Medicare Other | Attending: Emergency Medicine | Admitting: Emergency Medicine

## 2018-07-28 ENCOUNTER — Other Ambulatory Visit: Payer: Self-pay

## 2018-07-28 DIAGNOSIS — R110 Nausea: Secondary | ICD-10-CM | POA: Diagnosis not present

## 2018-07-28 DIAGNOSIS — R51 Headache: Secondary | ICD-10-CM | POA: Diagnosis not present

## 2018-07-28 DIAGNOSIS — Z5321 Procedure and treatment not carried out due to patient leaving prior to being seen by health care provider: Secondary | ICD-10-CM | POA: Diagnosis not present

## 2018-07-28 DIAGNOSIS — F419 Anxiety disorder, unspecified: Secondary | ICD-10-CM | POA: Diagnosis not present

## 2018-07-28 DIAGNOSIS — R42 Dizziness and giddiness: Secondary | ICD-10-CM | POA: Diagnosis not present

## 2018-07-28 DIAGNOSIS — I959 Hypotension, unspecified: Secondary | ICD-10-CM | POA: Diagnosis not present

## 2018-07-28 DIAGNOSIS — R0602 Shortness of breath: Secondary | ICD-10-CM | POA: Diagnosis not present

## 2018-07-28 NOTE — ED Triage Notes (Signed)
Pt arrived stating she was bringing groceries upstairs to her apartment and became short of breath and nauseated, causing her to feel anxious. Pt feels better upon arrival. Reports mild headache.

## 2018-12-21 ENCOUNTER — Emergency Department (HOSPITAL_COMMUNITY): Payer: Medicare Other

## 2018-12-21 ENCOUNTER — Ambulatory Visit: Payer: Self-pay

## 2018-12-21 ENCOUNTER — Emergency Department (HOSPITAL_COMMUNITY)
Admission: EM | Admit: 2018-12-21 | Discharge: 2018-12-21 | Disposition: A | Discharge status: Home / Self Care | Payer: Medicare Other | Attending: Emergency Medicine | Admitting: Emergency Medicine

## 2018-12-21 ENCOUNTER — Encounter (HOSPITAL_COMMUNITY): Payer: Self-pay

## 2018-12-21 DIAGNOSIS — Z5321 Procedure and treatment not carried out due to patient leaving prior to being seen by health care provider: Secondary | ICD-10-CM | POA: Diagnosis not present

## 2018-12-21 DIAGNOSIS — R2241 Localized swelling, mass and lump, right lower limb: Secondary | ICD-10-CM | POA: Insufficient documentation

## 2018-12-21 DIAGNOSIS — M25461 Effusion, right knee: Secondary | ICD-10-CM | POA: Diagnosis not present

## 2018-12-21 NOTE — ED Triage Notes (Signed)
Pt states that for the past two weeks she has been having swelling to R knee and lower legs, denies injury to knee

## 2018-12-21 NOTE — ED Notes (Signed)
Pt and family member informed RN that they will follow up w/ PCP in the AM.

## 2018-12-21 NOTE — Telephone Encounter (Signed)
Patient not available .Marland Kitchen Daughter state than Mom id at her residence.  Will call Patient shortly. Telephone call to Patient  Triaged Patient .  Pain at where the right leg bends in right leg.  Rate is mild.  Patients states pain is rated mild at present. States it varies .  Uncomfortable when she gets up.  Reviewed protocol with Patient.  Recommended Patient be further evaluated at Urgent Care Pt.  Voiced understanding.  Call to daughter recommended Patient be evaluated Urgent Care .  Voiced understanding.    Reason for Disposition . Can't move joint normally (bend and straighten completely)  Answer Assessment - Initial Assessment Questions 1. ONSET: "When did the pain start?"     About a week 2. LOCATION: "Where is the pain located?"      Right leg  3. PAIN: "How bad is the pain?"    (Scale 1-10; or mild, moderate, severe)   -  MILD (1-3): doesn't interfere with normal activities    -  MODERATE (4-7): interferes with normal activities (e.g., work or school) or awakens from sleep, limping    -  SEVERE (8-10): excruciating pain, unable to do any normal activities, unable to walk     mild 4. WORK OR EXERCISE: "Has there been any recent work or exercise that involved this part of the body?"      5. CAUSE: "What do you think is causing the leg pain?"    yes 6. OTHER SYMPTOMS: "Do you have any other symptoms?" (e.g., chest pain, back pain, breathing difficulty, swelling, rash, fever, numbness, weakness)     swelling 7. PREGNANCY: "Is there any chance you are pregnant?" "When was your last menstrual period?"    Na  Protocols used: KNEE SWELLING-A-AH, LEG PAIN-A-AH

## 2018-12-22 ENCOUNTER — Other Ambulatory Visit: Payer: Self-pay

## 2018-12-22 ENCOUNTER — Other Ambulatory Visit (INDEPENDENT_AMBULATORY_CARE_PROVIDER_SITE_OTHER): Payer: Medicare Other

## 2018-12-22 ENCOUNTER — Encounter: Payer: Self-pay | Admitting: Internal Medicine

## 2018-12-22 ENCOUNTER — Ambulatory Visit (INDEPENDENT_AMBULATORY_CARE_PROVIDER_SITE_OTHER): Payer: Medicare Other | Admitting: Internal Medicine

## 2018-12-22 VITALS — BP 122/78 | HR 72 | Temp 97.7°F | Ht 63.0 in | Wt 152.0 lb

## 2018-12-22 DIAGNOSIS — M25561 Pain in right knee: Secondary | ICD-10-CM

## 2018-12-22 DIAGNOSIS — E611 Iron deficiency: Secondary | ICD-10-CM

## 2018-12-22 DIAGNOSIS — R739 Hyperglycemia, unspecified: Secondary | ICD-10-CM

## 2018-12-22 DIAGNOSIS — J449 Chronic obstructive pulmonary disease, unspecified: Secondary | ICD-10-CM

## 2018-12-22 LAB — CBC WITH DIFFERENTIAL/PLATELET
Basophils Absolute: 0.1 10*3/uL (ref 0.0–0.1)
Basophils Relative: 0.9 % (ref 0.0–3.0)
Eosinophils Absolute: 0.2 10*3/uL (ref 0.0–0.7)
Eosinophils Relative: 2.7 % (ref 0.0–5.0)
HCT: 38.4 % (ref 36.0–46.0)
Hemoglobin: 13.2 g/dL (ref 12.0–15.0)
Lymphocytes Relative: 26.8 % (ref 12.0–46.0)
Lymphs Abs: 2.2 10*3/uL (ref 0.7–4.0)
MCHC: 34.4 g/dL (ref 30.0–36.0)
MCV: 88.5 fl (ref 78.0–100.0)
Monocytes Absolute: 0.8 10*3/uL (ref 0.1–1.0)
Monocytes Relative: 9.7 % (ref 3.0–12.0)
Neutro Abs: 4.9 10*3/uL (ref 1.4–7.7)
Neutrophils Relative %: 59.9 % (ref 43.0–77.0)
Platelets: 309 10*3/uL (ref 150.0–400.0)
RBC: 4.33 Mil/uL (ref 3.87–5.11)
RDW: 13.3 % (ref 11.5–15.5)
WBC: 8.2 10*3/uL (ref 4.0–10.5)

## 2018-12-22 LAB — IBC PANEL
Iron: 81 ug/dL (ref 42–145)
Saturation Ratios: 21.3 % (ref 20.0–50.0)
Transferrin: 271 mg/dL (ref 212.0–360.0)

## 2018-12-22 LAB — BASIC METABOLIC PANEL
BUN: 17 mg/dL (ref 6–23)
CO2: 33 mEq/L — ABNORMAL HIGH (ref 19–32)
Calcium: 9.2 mg/dL (ref 8.4–10.5)
Chloride: 101 mEq/L (ref 96–112)
Creatinine, Ser: 1.03 mg/dL (ref 0.40–1.20)
GFR: 51.35 mL/min — ABNORMAL LOW (ref >60.00)
Glucose, Bld: 113 mg/dL — ABNORMAL HIGH (ref 70–99)
Potassium: 3.1 mEq/L — ABNORMAL LOW (ref 3.5–5.1)
Sodium: 141 mEq/L (ref 135–145)

## 2018-12-22 LAB — FERRITIN: Ferritin: 15.2 ng/mL (ref 10.0–291.0)

## 2018-12-22 NOTE — Patient Instructions (Signed)
Please plan to see Dr [facility] at 1045 tomorrow AM  Please continue all other medications as before, and refills have been done if requested.  Please have the pharmacy call with any other refills you may need.  Please continue your efforts at being more active, low cholesterol diet, and weight control.  Please keep your appointments with your specialists as you may have planned  You will be contacted regarding the referral for: Gastroenterology  Please go to the LAB in the Basement (turn left off the elevator) for the tests to be done today  You will be contacted by phone if any changes need to be made immediately.  Otherwise, you will receive a letter about your results with an explanation, but please check with MyChart first.  Please remember to sign up for MyChart if you have not done so, as this will be important to you in the future with finding out test results, communicating by private email, and scheduling acute appointments online when needed.

## 2018-12-22 NOTE — Progress Notes (Signed)
Subjective:    Patient ID: Desiree [name], female    DOB: Nov 29, 1935, 83 y.o.   MRN: [id number]  HPI  Here to f/u after unable to be seen at UC x 2 then ED, waited 4 hrs, got too crowded and told it would be another 4 hrs but then just went home. Has had mild sob, nervous, anxious, mild HA but main c/o is onset x 2 wks of right knee pain, swelling, knot in the back of the knee, and swelling to the toes from the knee.  Pt denies chest pain, increased sob or doe, wheezing, orthopnea, PND, increased LE swelling, palpitations, dizziness or syncope except for the above.  Pt denies new neurological symptoms such as new headache, or facial or extremity weakness or numbness   Pt denies polydipsia, polyuria Wt Readings from Last 3 Encounters:  12/22/18 152 lb (68.9 kg)  07/02/18 148 lb (67.1 kg)  02/26/18 144 lb (65.3 kg)   Past Medical History:  Diagnosis Date  . Anxiety 02/13/2017  . Asthma   . B12 deficiency anemia 02/13/2017  . Chronic back pain 02/13/2017  . Chronic cough 02/13/2017  . COPD (chronic obstructive pulmonary disease) (HCC)   . Depression 02/13/2017  . Diverticulosis 02/13/2017  . Former smoker 2001  . GERD (gastroesophageal reflux disease) 02/13/2017  . HLD (hyperlipidemia) 02/13/2017  . HTN (hypertension) 02/13/2017  . Lumbar compression fracture (HCC)   . Osteopenia 02/13/2017  . Spinal stenosis 02/13/2017   Past Surgical History:  Procedure Laterality Date  . APPENDECTOMY    . CATARACT EXTRACTION, BILATERAL    . KYPHOPLASTY  2010  . left hip surgury    . right hip surgury      reports that she has quit smoking. She has never used smokeless tobacco. She reports current alcohol use. She reports that she does not use drugs. family history includes Depression in her father; Lung cancer in her brother and sister; Mesothelioma in her son. Allergies  Allergen Reactions  . Levaquin [Levofloxacin] Hives  . Percocet [Oxycodone-Acetaminophen] Hives  . Azithromycin Rash   Current  Outpatient Medications on File Prior to Visit  Medication Sig Dispense Refill  . albuterol (PROVENTIL HFA;VENTOLIN HFA) 108 (90 Base) MCG/ACT inhaler Inhale 2 puffs into the lungs every 6 (six) hours as needed for wheezing or shortness of breath. 18 g 11  . albuterol (PROVENTIL) (2.5 MG/3ML) 0.083% nebulizer solution Take 3 mLs (2.5 mg total) by nebulization every 6 (six) hours as needed for wheezing or shortness of breath. 75 mL 12  . citalopram (CELEXA) 20 MG tablet Take 1 tablet (20 mg total) by mouth daily. 90 tablet 3  . CYANOCOBALAMIN PO Take 1 tablet by mouth at bedtime.    . Fluticasone-Salmeterol (ADVAIR) 500-50 MCG/DOSE AEPB Inhale 1 puff into the lungs 2 (two) times a day. 180 each 3  . HYDROCHLOROTHIAZIDE PO Take 1 tablet by mouth daily.    . iron polysaccharides (NU-IRON) 150 MG capsule Take 1 capsule (150 mg total) by mouth daily. 90 capsule 1  . losartan (COZAAR) 100 MG tablet Take 1 tablet (100 mg total) by mouth daily. 90 tablet 3  . montelukast (SINGULAIR) 10 MG tablet Take 10 mg by mouth at bedtime.    . pantoprazole (PROTONIX) 40 MG tablet Take 1 tablet (40 mg total) by mouth daily. 90 tablet 3   No current facility-administered medications on file prior to visit.    Review of Systems  Constitutional: Negative for other unusual diaphoresis or sweats  HENT: Negative for ear discharge or swelling Eyes: Negative for other worsening visual disturbances Respiratory: Negative for stridor or other swelling  Gastrointestinal: Negative for worsening distension or other blood Genitourinary: Negative for retention or other urinary change Musculoskeletal: Negative for other MSK pain or swelling Skin: Negative for color change or other new lesions Neurological: Negative for worsening tremors and other numbness  Psychiatric/Behavioral: Negative for worsening agitation or other fatigue All otherwise neg per pt     Objective:   Physical Exam BP 122/78   Pulse 72   Temp 97.7 F  (36.5 C) (Oral)   Ht 5\' 3"  (1.6 m)   Wt 152 lb (68.9 kg)   SpO2 96%   BMI 26.93 kg/m  VS noted,  Constitutional: Pt appears in NAD HENT: Head: NCAT.  Right Ear: External ear normal.  Left Ear: External ear normal.  Eyes: . Pupils are equal, round, and reactive to light. Conjunctivae and EOM are normal Nose: without d/c or deformity Neck: Neck supple. Gross normal ROM Cardiovascular: Normal rate and regular rhythm.   Pulmonary/Chest: Effort normal and breath sounds without rales or wheezing.  Right knee 1+ effusion with baker cyst posteriorly, and 1+ distal RLE swelling with no calf tenderness Neurological: Pt is alert. At baseline orientation, motor grossly intact Skin: Skin is warm. No rashes, other new lesions, no LE edema Psychiatric: Pt behavior is normal without agitation  All otherwise neg per pt  Lab Results  Component Value Date   WBC 8.7 07/02/2018   HGB 12.7 07/02/2018   HCT 38.3 07/02/2018   PLT 328.0 07/02/2018   GLUCOSE 97 07/02/2018   CHOL 201 (H) 07/02/2018   TRIG 124.0 07/02/2018   HDL 61.40 07/02/2018   LDLCALC 115 (H) 07/02/2018   ALT 12 07/02/2018   AST 15 07/02/2018   NA 140 07/02/2018   K 3.5 07/02/2018   CL 100 07/02/2018   CREATININE 1.09 07/02/2018   BUN 21 07/02/2018   CO2 32 07/02/2018   TSH 1.42 07/02/2018   HGBA1C 6.2 07/02/2018      Assessment & Plan:

## 2018-12-23 ENCOUNTER — Other Ambulatory Visit: Payer: Self-pay | Admitting: Internal Medicine

## 2018-12-23 ENCOUNTER — Encounter: Payer: Self-pay | Admitting: Internal Medicine

## 2018-12-23 ENCOUNTER — Ambulatory Visit: Payer: Self-pay

## 2018-12-23 ENCOUNTER — Ambulatory Visit (INDEPENDENT_AMBULATORY_CARE_PROVIDER_SITE_OTHER): Payer: Medicare Other | Admitting: Family Medicine

## 2018-12-23 ENCOUNTER — Encounter: Payer: Self-pay | Admitting: Family Medicine

## 2018-12-23 VITALS — BP 110/70 | HR 84 | Ht 63.0 in | Wt 152.0 lb

## 2018-12-23 DIAGNOSIS — M7121 Synovial cyst of popliteal space [Baker], right knee: Secondary | ICD-10-CM | POA: Diagnosis not present

## 2018-12-23 DIAGNOSIS — M25561 Pain in right knee: Secondary | ICD-10-CM

## 2018-12-23 DIAGNOSIS — M1711 Unilateral primary osteoarthritis, right knee: Secondary | ICD-10-CM | POA: Diagnosis not present

## 2018-12-23 MED ORDER — POTASSIUM CHLORIDE ER 10 MEQ PO TBCR: 10.0000 meq | EXTENDED_RELEASE_TABLET | Freq: Every day | ORAL | 3 refills | Status: DC

## 2018-12-23 NOTE — Patient Instructions (Addendum)
Good to see you.  Compression sleeve Voltaren 2x a day Vitamin D 2000IU daily See me in 5 weeks if not improved

## 2018-12-23 NOTE — Assessment & Plan Note (Signed)
Aspiration done today.  Discussed which activities to do which wants to avoid.  Discussed topical anti-inflammatories, compression, I do not feel the patient's instability is severe yet for custom brace but may in the near future.  Discussed the possibility of Doppler then patient's daughter and patient herself declined.  Patient knows of worsening symptoms to call but we will have patient follow-up with [place] again in 4 to 6 weeks

## 2018-12-23 NOTE — Progress Notes (Signed)
[name] [address] [place], [place] [address] Phone: [phone] Subjective:   Desiree [name], am serving as a scribe for Dr. Hulan Saas. This visit occurred during the SARS-CoV-2 public health emergency.  Safety protocols were in place, including screening questions prior to the visit, additional usage of staff PPE, and extensive cleaning of exam room while observing appropriate contact time as indicated for disinfecting solutions.   I'm seeing this patient by the request  of:  Biagio Borg, MD   CC: Right knee  [id number]  Desiree [name] is a 83 y.o. female coming in with complaint of right knee pain for the past 3 weeks. Is having hard time walking. Trouble with flexion and extension. Denies any calf pain.  Patient initially had increasing swelling.  Was sent by primary care provider and ruled out a DVT.  X-rays of the knee were also taken on December 21, 2018.  These were independently visualized by me.  Found to have a small effusion.  Mild arthritic changes.  Otherwise unremarkable     Past Medical History:  Diagnosis Date  . Anxiety 02/13/2017  . Asthma   . B12 deficiency anemia 02/13/2017  . Chronic back pain 02/13/2017  . Chronic cough 02/13/2017  . COPD (chronic obstructive pulmonary disease) ([place])   . Depression 02/13/2017  . Diverticulosis 02/13/2017  . Former smoker 2001  . GERD (gastroesophageal reflux disease) 02/13/2017  . HLD (hyperlipidemia) 02/13/2017  . HTN (hypertension) 02/13/2017  . Lumbar compression fracture ([place])   . Osteopenia 02/13/2017  . Spinal stenosis 02/13/2017   Past Surgical History:  Procedure Laterality Date  . APPENDECTOMY    . CATARACT EXTRACTION, BILATERAL    . KYPHOPLASTY  2010  . left hip surgury    . right hip surgury     Social History   Socioeconomic History  . Marital status: Single    Spouse name: Not on file  . Number of children: Not on file  . Years of education: Not on file  . Highest  education level: Not on file  Occupational History  . Not on file  Social Needs  . Financial resource strain: Not on file  . Food insecurity    Worry: Not on file    Inability: Not on file  . Transportation needs    Medical: Not on file    Non-medical: Not on file  Tobacco Use  . Smoking status: Former Research scientist (life sciences)  . Smokeless tobacco: Never Used  Substance and Sexual Activity  . Alcohol use: Yes  . Drug use: [name]  . Sexual activity: Not on file  Lifestyle  . Physical activity    Days per week: Not on file    Minutes per session: Not on file  . Stress: Not on file  Relationships  . Social Herbalist on phone: Not on file    Gets together: Not on file    Attends religious service: Not on file    Active member of club or organization: Not on file    Attends meetings of clubs or organizations: Not on file    Relationship status: Not on file  Other Topics Concern  . Not on file  Social History Narrative  . Not on file   Allergies  Allergen Reactions  . Levaquin [Levofloxacin] Hives  . Percocet [Oxycodone-Acetaminophen] Hives  . Azithromycin Rash   Family History  Problem Relation Age of Onset  . Depression Father   .  Lung cancer Sister   . Lung cancer Brother   . Mesothelioma Son      Current Outpatient Medications (Cardiovascular):  Marland Kitchen.  HYDROCHLOROTHIAZIDE PO, Take 1 tablet by mouth daily. Marland Kitchen.  losartan (COZAAR) 100 MG tablet, Take 1 tablet (100 mg total) by mouth daily.  Current Outpatient Medications (Respiratory):  .  albuterol (PROVENTIL HFA;VENTOLIN HFA) 108 (90 Base) MCG/ACT inhaler, Inhale 2 puffs into the lungs every 6 (six) hours as needed for wheezing or shortness of breath. Marland Kitchen.  albuterol (PROVENTIL) (2.5 MG/3ML) 0.083% nebulizer solution, Take 3 mLs (2.5 mg total) by nebulization every 6 (six) hours as needed for wheezing or shortness of breath. .  Fluticasone-Salmeterol (ADVAIR) 500-50 MCG/DOSE AEPB, Inhale 1 puff into the lungs 2 (two) times a day.  .  montelukast (SINGULAIR) 10 MG tablet, Take 10 mg by mouth at bedtime.   Current Outpatient Medications (Hematological):  Marland Kitchen.  CYANOCOBALAMIN PO, Take 1 tablet by mouth at bedtime. .  iron polysaccharides (NU-IRON) 150 MG capsule, Take 1 capsule (150 mg total) by mouth daily.  Current Outpatient Medications (Other):  .  citalopram (CELEXA) 20 MG tablet, Take 1 tablet (20 mg total) by mouth daily. .  pantoprazole (PROTONIX) 40 MG tablet, Take 1 tablet (40 mg total) by mouth daily.    Past medical history, social, surgical and family history all reviewed in electronic medical record.  [name] pertanent information unless stated regarding to the chief complaint.   Review of Systems:  [name] headache, visual changes, nausea, vomiting, diarrhea, constipation, dizziness, abdominal pain, skin rash, fevers, chills, night sweats, weight loss, swollen lymph nodes, body aches, joint swelling, muscle aches, chest pain, shortness of breath, mood changes.   Objective  There were [name] vitals taken for this visit. Systems examined below as of    General: [name] apparent distress alert and oriented x3 mood and affect normal, dressed appropriately.  HEENT: Pupils equal, extraocular movements intact  Respiratory: Patient's speak in full sentences and does not appear short of breath  Cardiovascular: [name] lower extremity edema, non tender, [name] erythema  Skin: Warm dry intact with [name] signs of infection or rash on extremities or on axial skeleton.  Abdomen: Soft nontender  Neuro: Cranial nerves II through XII are intact, neurovascularly intact in all extremities with 2+ DTRs and 2+ pulses.  Lymph: [name] lymphadenopathy of posterior or anterior cervical chain or axillae bilaterally.  MSK:  Non tender with full range of motion and good stability and symmetric strength and tone of shoulders, elbows, wrist, hip, knee and ankles bilaterally.  Mild antalgic gait with arthritic changes  Right knee exam does show moderate arthritic  changes in the medial joint space.  Mild valgus deformity noted.  Patient has good range of motion but lacks the last 10 degrees of flexion.  Very small cyst palpated in the popliteal area.  Limited musculoskeletal ultrasound was performed and interpreted by Judi SaaZachary M Velvie Thomaston  Limited ultrasound of patient's knee shows the patient does have moderate arthritic changes of the patellofemoral and medial joint space.  A chronic degenerative meniscal tear noted medially with mild displacement.  Patient does have a Baker's cyst noted. Impression: Knee arthritis with Baker's cyst  Procedure: Real-time Ultrasound Guided Injection of right knee Device: GE Logiq Q7 Ultrasound guided injection is preferred based studies that show increased duration, increased effect, greater accuracy, decreased procedural pain, increased response rate, and decreased cost with ultrasound guided versus blind injection.  Verbal informed consent obtained.  Time-out conducted.  Noted [name] overlying  erythema, induration, or other signs of local infection.  Skin prepped in a sterile fashion.  Local anesthesia: Topical Ethyl chloride.  With sterile technique and under real time ultrasound guidance: With a 22-gauge 2 inch needle patient was injected with 4 cc of 0.5% Marcaine and aspirated 15 cc of straw-colored fluid then injected 1 cc of Kenalog 40 mg/dL. This was from a posterior approach.  Completed without difficulty  Pain immediately resolved suggesting accurate placement of the medication.  Advised to call if fevers/chills, erythema, induration, drainage, or persistent bleeding.  Images permanently stored and available for review in the ultrasound unit.  Impression: Technically successful ultrasound guided injection.   Impression and Recommendations:     This case required medical decision making of moderate complexity. The above documentation has been reviewed and is accurate and complete Judi Saa, DO       Note:  This dictation was prepared with Dragon dictation along with smaller phrase technology. Any transcriptional errors that result from this process are unintentional.

## 2018-12-24 ENCOUNTER — Telehealth: Payer: Self-pay | Admitting: Internal Medicine

## 2018-12-24 NOTE — Telephone Encounter (Signed)
Copied from [facility] [id number]. Topic: General - Other >> Dec 24, 2018 11:41 AM Keene Breath wrote: Reason for CRM: Patient's daughter called to ask the nurse to call regarding her mom's condition.  Please leave a VM if she doesn't answer.  Call [facility] at [phone]

## 2018-12-25 ENCOUNTER — Encounter: Payer: Self-pay | Admitting: Internal Medicine

## 2018-12-25 NOTE — Assessment & Plan Note (Signed)
stable overall by history and exam, recent data reviewed with pt, and pt to continue medical treatment as before,  to f/u any worsening symptoms or concerns  

## 2018-12-25 NOTE — Assessment & Plan Note (Addendum)
C/w djd, has likely baker cyst posteriorly that may be amenable to drainage, for pain control and f/u sport med dec 3 at 1045

## 2018-12-25 NOTE — Assessment & Plan Note (Signed)
For recheck lab, refer GI

## 2019-01-06 ENCOUNTER — Other Ambulatory Visit: Payer: Self-pay | Admitting: Internal Medicine

## 2019-01-06 MED ORDER — CITALOPRAM HYDROBROMIDE 20 MG PO TABS: 20.0000 mg | ORAL_TABLET | Freq: Every day | ORAL | 0 refills | Status: DC

## 2019-01-06 NOTE — Telephone Encounter (Signed)
Pt would like to know if she could get a week supply sent to local pharmacy

## 2019-01-06 NOTE — Telephone Encounter (Signed)
Medication refill: citalopram (CELEXA) 20 MG tablet [[id number]]   Pharmacy:  [facility] [place] Phone:  [phone]  Fax:  [phone]     Pt aware of turn around time    Pt would like to know if she could get a week supply sent to local pharmacy  [facility] [place], [place] - [address] Phone:  [phone]  Fax:  [phone]

## 2019-01-27 ENCOUNTER — Ambulatory Visit: Payer: Medicare Other | Admitting: Family Medicine

## 2019-02-01 ENCOUNTER — Telehealth: Payer: Self-pay | Admitting: *Deleted

## 2019-02-01 NOTE — Telephone Encounter (Signed)
Attempted to return call to pt's daughter.  Per her request, left detailed vm. Re: question of proceeding with taking COVID vaccine this Friday, if pt. Has had poss. recent exposure to COVID.  Advised in voice message that if pt. develops any COVID symptoms, she should cancel the appt. for the vaccine, since symptoms of COVID could mask any reaction to the vaccine.  Further advised if pt. Does not have any symptoms, she should plan to receive the vaccine.  Advised to return call to [phone], if further questions.  Will forward this message to pt's PCP, to make him aware.

## 2019-02-01 NOTE — Telephone Encounter (Signed)
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 She says the patient has an appointment to get the covid vaccine on this Friday ,and would like to know how if she should keep the appointment ,please advise  [phone], it is ok to leave a detailed message.

## 2019-02-04 ENCOUNTER — Encounter: Payer: Self-pay | Admitting: Internal Medicine

## 2019-02-21 ENCOUNTER — Other Ambulatory Visit: Payer: Self-pay

## 2019-02-21 ENCOUNTER — Telehealth: Payer: Self-pay | Admitting: Internal Medicine

## 2019-02-21 MED ORDER — HYDROCHLOROTHIAZIDE 25 MG PO TABS: 25.0000 mg | ORAL_TABLET | Freq: Every day | ORAL | 0 refills | Status: DC

## 2019-02-21 NOTE — Telephone Encounter (Signed)
Refill for hydrochlorothiazide 25mg  [facility]

## 2019-02-21 NOTE — Telephone Encounter (Signed)
Requesting refill on hydrochlorothiazide 25mg .  Ref # 

## 2019-02-22 ENCOUNTER — Other Ambulatory Visit: Payer: Self-pay | Admitting: Internal Medicine

## 2019-02-22 NOTE — Telephone Encounter (Signed)
        1. Which medications need to be refilled? (please list name of each medication and dose if known) montelukast (SINGULAIR) 10 MG tablet  2. Which pharmacy/location (including street and city if local pharmacy) is medication to be sent to? [facility] - [place], [place] - [address]  3. Do they need a 30 day or 90 day supply? 90

## 2019-02-24 ENCOUNTER — Ambulatory Visit: Payer: Self-pay

## 2019-02-24 ENCOUNTER — Other Ambulatory Visit: Payer: Self-pay

## 2019-02-24 ENCOUNTER — Encounter: Payer: Self-pay | Admitting: Family Medicine

## 2019-02-24 ENCOUNTER — Ambulatory Visit (INDEPENDENT_AMBULATORY_CARE_PROVIDER_SITE_OTHER): Payer: Medicare Other | Admitting: Family Medicine

## 2019-02-24 VITALS — BP 128/72 | HR 77 | Ht 63.0 in | Wt 153.4 lb

## 2019-02-24 DIAGNOSIS — M25562 Pain in left knee: Secondary | ICD-10-CM

## 2019-02-24 DIAGNOSIS — M25561 Pain in right knee: Secondary | ICD-10-CM | POA: Diagnosis not present

## 2019-02-24 DIAGNOSIS — M7121 Synovial cyst of popliteal space [Baker], right knee: Secondary | ICD-10-CM

## 2019-02-24 DIAGNOSIS — G8929 Other chronic pain: Secondary | ICD-10-CM

## 2019-02-24 MED ORDER — MONTELUKAST SODIUM 10 MG PO TABS: 10.0000 mg | ORAL_TABLET | Freq: Every day | ORAL | 1 refills | Status: DC

## 2019-02-24 NOTE — Progress Notes (Signed)
I, Desiree [name], LAT, ATC, am serving as scribe for Dr. Clementeen Graham.  Desiree [name] is a 84 y.o. female who presents to [facility] today for f/u of R knee pain and swelling.  She was last seen by Dr. Katrinka Blazing on 12/23/18 for similar symptoms and had a R knee aspiration and injection.  She was advised to use Voltaren gel, take vit D 2000 IUs daily and use a knee compression sleeve.  Since her last visit, pt reports moderate pain in her B knees (R>L).  She reports swelling in her B knees.  She denies any mechanical symptoms in her knees and denies numbness/tingling into her B LEs.  Aggravating factors include knee flexion and ambulation.  Symptoms restarted recently.  Her symptoms are not as bad as they were when she saw Dr. Katrinka Blazing in early December.  She notes that she has been somewhat inconsistent with Voltaren gel on her knee and compression sleeve.  Diagnostic testing: R knee XR - 12/21/18   Pertinent review of systems: No fevers or chills.  Relevant historical information: Hypertension, COPD   Exam:  BP 128/72 (BP Location: Left Arm, Patient Position: Sitting, Cuff Size: Normal)   Pulse 77   Ht 5\' 3"  (1.6 m)   Wt 153 lb 6.4 oz (69.6 kg)   SpO2 96%   BMI 27.17 kg/m  General: Well Developed, well nourished, and in no acute distress.   MSK:  Right knee: Mild effusion no significant visible Baker's cyst.  Normal-appearing otherwise. Range of motion 0-120 degrees with mild crepitation. Nontender. Stable ligamentous exam.  Left knee: Minimal effusion.  No Baker cyst visible.  Normal-appearing otherwise. Range of motion 0-120 degrees with mild crepitation.  Nontender. Stable ligamentous exam.   Lab and Radiology Results Limited musculoskeletal ultrasound right knee. Quad tendon intact mild suprapatellar effusion. Normal patellar tendon. Tiny Baker's cyst measuring less than 1 cm in diameter posterior medial knee.  Limited musculoskeletal ultrasound  left knee. Intact quad tendon.  Mild suprapatellar effusion. Normal patellar tendon. Trace Baker's cyst very small present posterior medial knee.  Procedure: Real-time Ultrasound Guided Injection of right knee lateral superior patellar Device: Philips Affiniti 50G Images permanently stored and available for review in the ultrasound unit. Verbal informed consent obtained.  Discussed risks and benefits of procedure. Warned about infection bleeding damage to structures skin hypopigmentation and fat atrophy among others. Patient expresses understanding and agreement Time-out conducted.   Noted no overlying erythema, induration, or other signs of local infection.   Skin prepped in a sterile fashion.   Local anesthesia: Topical Ethyl chloride.   With sterile technique and under real time ultrasound guidance:  40 mg of Kenalog and 3 mL of Marcaine injected easily.   Completed without difficulty   Pain immediately resolved suggesting accurate placement of the medication.   Advised to call if fevers/chills, erythema, induration, drainage, or persistent bleeding.   Images permanently stored and available for review in the ultrasound unit.  Impression: Technically successful ultrasound guided injection.          Assessment and Plan: 84 y.o. female with bilateral knee pain and discomfort. Patient has degenerative changes seen on right knee x-ray in December as well as on Dr. January ultrasound in early December.  This is the predominant driving factor for her knee effusion and Baker's cyst and knee pain. Today she has only a tiny Baker's cyst certainly small enough that aspirating the Baker's cyst does not worth it. Plan to  proceed with trial of steroid injection again.  Emphasized the importance of Voltaren gel and compressive knee sleeve. If not sufficient or if symptoms return may proceed with hyaluronic acid injections.  Discussed this with patient and her family and provided handout.   Additionally PRP injections in the future may be helpful as well.  Recheck back with myself or Dr. Tamala Julian as needed.    Orders Placed This Encounter  Procedures  . [place] LIMITED JOINT SPACE STRUCTURES LOW BILAT(NO LINKED CHARGES)    Order Specific Question:   Reason for Exam (SYMPTOM  OR DIAGNOSIS REQUIRED)    Answer:   B knee pain    Order Specific Question:   Preferred imaging location?    Answer:   [facility]   No orders of the defined types were placed in this encounter.    Discussed warning signs or symptoms. Please see discharge instructions. Patient expresses understanding.   The above documentation has been reviewed and is accurate and complete [name]

## 2019-02-24 NOTE — Patient Instructions (Signed)
Thank you for coming in today.  Call or go to the ER if you develop a large red swollen joint with extreme pain or oozing puss.  Use the voltaren gel on the knee 4x daily.  Use the compression sleeve as needed.  If this does not help we can do the Gel Shots as needed.

## 2019-02-24 NOTE — Telephone Encounter (Signed)
Reviewed chart pt is up-to-date [facility].../lmb  

## 2019-03-08 DIAGNOSIS — Z20828 Contact with and (suspected) exposure to other viral communicable diseases: Secondary | ICD-10-CM | POA: Diagnosis not present

## 2019-03-08 DIAGNOSIS — Z03818 Encounter for observation for suspected exposure to other biological agents ruled out: Secondary | ICD-10-CM | POA: Diagnosis not present

## 2019-03-14 DIAGNOSIS — Z03818 Encounter for observation for suspected exposure to other biological agents ruled out: Secondary | ICD-10-CM | POA: Diagnosis not present

## 2019-03-14 DIAGNOSIS — Z20828 Contact with and (suspected) exposure to other viral communicable diseases: Secondary | ICD-10-CM | POA: Diagnosis not present

## 2019-03-15 ENCOUNTER — Ambulatory Visit (INDEPENDENT_AMBULATORY_CARE_PROVIDER_SITE_OTHER): Payer: Medicare Other | Admitting: Family

## 2019-03-15 ENCOUNTER — Other Ambulatory Visit: Payer: Self-pay

## 2019-03-15 ENCOUNTER — Ambulatory Visit (INDEPENDENT_AMBULATORY_CARE_PROVIDER_SITE_OTHER)
Admission: RE | Admit: 2019-03-15 | Discharge: 2019-03-15 | Disposition: A | Discharge status: Home / Self Care | Payer: Medicare Other | Source: Ambulatory Visit | Attending: Family | Admitting: Family

## 2019-03-15 ENCOUNTER — Other Ambulatory Visit (INDEPENDENT_AMBULATORY_CARE_PROVIDER_SITE_OTHER): Payer: Medicare Other

## 2019-03-15 ENCOUNTER — Telehealth: Payer: Self-pay | Admitting: Family

## 2019-03-15 DIAGNOSIS — R109 Unspecified abdominal pain: Secondary | ICD-10-CM

## 2019-03-15 LAB — COMPREHENSIVE METABOLIC PANEL
ALT: 13 U/L (ref 0–35)
AST: 14 U/L (ref 0–37)
Albumin: 3.9 g/dL (ref 3.5–5.2)
Alkaline Phosphatase: 51 U/L (ref 39–117)
BUN: 20 mg/dL (ref 6–23)
CO2: 32 mEq/L (ref 19–32)
Calcium: 9.2 mg/dL (ref 8.4–10.5)
Chloride: 99 mEq/L (ref 96–112)
Creatinine, Ser: 1.45 mg/dL — ABNORMAL HIGH (ref 0.40–1.20)
GFR: 34.59 mL/min — ABNORMAL LOW (ref >60.00)
Glucose, Bld: 124 mg/dL — ABNORMAL HIGH (ref 70–99)
Potassium: 3.5 mEq/L (ref 3.5–5.1)
Sodium: 139 mEq/L (ref 135–145)
Total Bilirubin: 0.8 mg/dL (ref 0.2–1.2)
Total Protein: 6.9 g/dL (ref 6.0–8.3)

## 2019-03-15 LAB — CBC WITH DIFFERENTIAL/PLATELET
Basophils Absolute: 0.1 10*3/uL (ref 0.0–0.1)
Basophils Relative: 1 % (ref 0.0–3.0)
Eosinophils Absolute: 0.4 10*3/uL (ref 0.0–0.7)
Eosinophils Relative: 4.2 % (ref 0.0–5.0)
HCT: 38.9 % (ref 36.0–46.0)
Hemoglobin: 13.2 g/dL (ref 12.0–15.0)
Lymphocytes Relative: 24.3 % (ref 12.0–46.0)
Lymphs Abs: 2.1 10*3/uL (ref 0.7–4.0)
MCHC: 33.9 g/dL (ref 30.0–36.0)
MCV: 88.9 fl (ref 78.0–100.0)
Monocytes Absolute: 1 10*3/uL (ref 0.1–1.0)
Monocytes Relative: 11.4 % (ref 3.0–12.0)
Neutro Abs: 5.1 10*3/uL (ref 1.4–7.7)
Neutrophils Relative %: 59.1 % (ref 43.0–77.0)
Platelets: 264 10*3/uL (ref 150.0–400.0)
RBC: 4.37 Mil/uL (ref 3.87–5.11)
RDW: 13.2 % (ref 11.5–15.5)
WBC: 8.7 10*3/uL (ref 4.0–10.5)

## 2019-03-15 NOTE — Progress Notes (Signed)
No acute abnormality noted;

## 2019-03-15 NOTE — Progress Notes (Signed)
Desiree [name] is a 84 y.o. female with the following history as recorded in EpicCare:  Patient Active Problem List   Diagnosis Date Noted  . Degenerative arthritis of right knee 12/23/2018  . Baker's cyst of knee, right 12/23/2018  . Right knee pain 12/22/2018  . Insomnia 07/04/2018  . B12 deficiency 07/04/2018  . Iron deficiency 07/03/2018  . Wheezing 02/26/2018  . Allergic rhinitis 12/25/2017  . COPD exacerbation ([place]) 02/13/2017  . Encounter for well adult exam with abnormal findings 02/13/2017  . Hyperglycemia 02/13/2017  . HLD (hyperlipidemia) 02/13/2017  . B12 deficiency anemia 02/13/2017  . Chronic back pain 02/13/2017  . Depression 02/13/2017  . Diverticulosis 02/13/2017  . Anxiety 02/13/2017  . Chronic cough 02/13/2017  . Spinal stenosis 02/13/2017  . Osteopenia 02/13/2017  . GERD (gastroesophageal reflux disease) 02/13/2017  . HTN (hypertension) 02/13/2017  . COPD (chronic obstructive pulmonary disease) ([place])   . Asthma   . Lumbar compression fracture [facility])     Current Outpatient Medications  Medication Sig Dispense Refill  . albuterol (PROVENTIL HFA;VENTOLIN HFA) 108 (90 Base) MCG/ACT inhaler Inhale 2 puffs into the lungs every 6 (six) hours as needed for wheezing or shortness of breath. 18 g 11  . albuterol (PROVENTIL) (2.5 MG/3ML) 0.083% nebulizer solution Take 3 mLs (2.5 mg total) by nebulization every 6 (six) hours as needed for wheezing or shortness of breath. 75 mL 12  . citalopram (CELEXA) 20 MG tablet Take 1 tablet (20 mg total) by mouth daily. 90 tablet 0  . CYANOCOBALAMIN PO Take 1 tablet by mouth at bedtime.    . Fluticasone-Salmeterol (ADVAIR) 500-50 MCG/DOSE AEPB Inhale 1 puff into the lungs 2 (two) times a day. 180 each 3  . hydrochlorothiazide (HYDRODIURIL) 25 MG tablet Take 1 tablet (25 mg total) by mouth daily. 90 tablet 0  . iron polysaccharides (NU-IRON) 150 MG capsule Take 1 capsule (150 mg total) by mouth daily. 90 capsule 1  . losartan  (COZAAR) 100 MG tablet Take 1 tablet (100 mg total) by mouth daily. 90 tablet 3  . montelukast (SINGULAIR) 10 MG tablet Take 1 tablet (10 mg total) by mouth at bedtime. 90 tablet 1  . pantoprazole (PROTONIX) 40 MG tablet Take 1 tablet (40 mg total) by mouth daily. 90 tablet 3  . potassium chloride (KLOR-CON) 10 MEQ tablet Take 1 tablet (10 mEq total) by mouth daily. 90 tablet 3   No current facility-administered medications for this visit.    Allergies: Levaquin [levofloxacin], Percocet [oxycodone-acetaminophen], and Azithromycin  Past Medical History:  Diagnosis Date  . Anxiety 02/13/2017  . Asthma   . B12 deficiency anemia 02/13/2017  . Chronic back pain 02/13/2017  . Chronic cough 02/13/2017  . COPD (chronic obstructive pulmonary disease) ([place])   . Depression 02/13/2017  . Diverticulosis 02/13/2017  . Former smoker 2001  . GERD (gastroesophageal reflux disease) 02/13/2017  . HLD (hyperlipidemia) 02/13/2017  . HTN (hypertension) 02/13/2017  . Lumbar compression fracture ([place])   . Osteopenia 02/13/2017  . Spinal stenosis 02/13/2017    Past Surgical History:  Procedure Laterality Date  . APPENDECTOMY    . CATARACT EXTRACTION, BILATERAL    . KYPHOPLASTY  2010  . left hip surgury    . right hip surgury      Family History  Problem Relation Age of Onset  . Depression Father   . Lung cancer Sister   . Lung cancer Brother   . Mesothelioma Son     Social History   Tobacco  Use  . Smoking status: Former Smoker  . Smokeless tobacco: Never Used  Substance Use Topics  . Alcohol use: Yes    Subjective:   I connected with Desiree [name] on 03/15/19 at 11:00 AM EST by a telephone call and verified that I am speaking with the correct person using two identifiers.   I discussed the limitations of evaluation and management by telemedicine and the availability of in person appointments. The patient expressed understanding and agreed to proceed. Provider in office/ patient is at home; provider  and patient's daughter and patient are only 3 people on phone call.   Intermittent abdominal pain "on and off" for the past 2 weeks; has had some episodes of diarrhea; notes that she was exposed to [place] at her retirement community but tests have been negative/ cleared to be out of quarantine today; has not had a bowel movement since last Friday; has had one episode of diverticulitis in the past 3 years; no nausea or vomiting or fever;    Objective:  There were no vitals filed for this visit.  Lungs: Respirations unlabored;  Neurologic: Alert and oriented; speech intact;   Assessment:  1. Abdominal pain, unspecified abdominal location     Plan:  Will update STAT CBC, CMP today and abdominal X-ray; assuming no obstruction is seen, will need to discuss in person evaluation tomorrow; follow up to be determined;   Time spent 15 minutes   No follow-ups on file.  Orders Placed This Encounter  Procedures  . DG Abd 2 Views    Standing Status:   Future    Number of Occurrences:   1    Standing Expiration Date:   05/12/2020    Order Specific Question:   Reason for Exam (SYMPTOM  OR DIAGNOSIS REQUIRED)    Answer:   abdominal pain    Order Specific Question:   Preferred imaging location?    Answer:   [facility]    Order Specific Question:   Radiology Contrast Protocol - do NOT remove file path    Answer:   [email or web address]  . CBC w/Diff    Standing Status:   Future    Number of Occurrences:   1    Standing Expiration Date:   03/14/2020  . Comp Met (CMET)    Standing Status:   Future    Number of Occurrences:   1    Standing Expiration Date:   03/14/2020    Requested Prescriptions    No prescriptions requested or ordered in this encounter

## 2019-03-15 NOTE — Telephone Encounter (Signed)
Olive Bass, FNP  03/15/2019 2:07 PM EST    I left message on daughter's cell number as discussed at time of virtual visit; no obstruction noted on X-ray and labs essentially normal; would recommend in office visit for further evaluation.

## 2019-03-15 NOTE — Telephone Encounter (Signed)
Spoke with patient's daughter and appointment made for Friday to see Dr. Jonny Ruiz.

## 2019-03-15 NOTE — Telephone Encounter (Signed)
    Please return call to daughter to discuss lab results 

## 2019-03-18 ENCOUNTER — Ambulatory Visit (INDEPENDENT_AMBULATORY_CARE_PROVIDER_SITE_OTHER): Payer: Medicare Other | Admitting: Internal Medicine

## 2019-03-18 ENCOUNTER — Encounter: Payer: Self-pay | Admitting: Internal Medicine

## 2019-03-18 ENCOUNTER — Other Ambulatory Visit: Payer: Self-pay

## 2019-03-18 VITALS — BP 130/70 | HR 89 | Temp 97.9°F | Ht 63.0 in | Wt 152.2 lb

## 2019-03-18 DIAGNOSIS — R197 Diarrhea, unspecified: Secondary | ICD-10-CM | POA: Insufficient documentation

## 2019-03-18 DIAGNOSIS — J449 Chronic obstructive pulmonary disease, unspecified: Secondary | ICD-10-CM | POA: Diagnosis not present

## 2019-03-18 DIAGNOSIS — N179 Acute kidney failure, unspecified: Secondary | ICD-10-CM | POA: Insufficient documentation

## 2019-03-18 DIAGNOSIS — R1030 Lower abdominal pain, unspecified: Secondary | ICD-10-CM | POA: Insufficient documentation

## 2019-03-18 LAB — CBC WITH DIFFERENTIAL/PLATELET
Basophils Absolute: 0.1 10*3/uL (ref 0.0–0.1)
Basophils Relative: 0.8 % (ref 0.0–3.0)
Eosinophils Absolute: 0.3 10*3/uL (ref 0.0–0.7)
Eosinophils Relative: 3.3 % (ref 0.0–5.0)
HCT: 36.9 % (ref 36.0–46.0)
Hemoglobin: 12.7 g/dL (ref 12.0–15.0)
Lymphocytes Relative: 21.6 % (ref 12.0–46.0)
Lymphs Abs: 1.7 10*3/uL (ref 0.7–4.0)
MCHC: 34.4 g/dL (ref 30.0–36.0)
MCV: 88.3 fl (ref 78.0–100.0)
Monocytes Absolute: 0.8 10*3/uL (ref 0.1–1.0)
Monocytes Relative: 10.9 % (ref 3.0–12.0)
Neutro Abs: 4.9 10*3/uL (ref 1.4–7.7)
Neutrophils Relative %: 63.4 % (ref 43.0–77.0)
Platelets: 285 10*3/uL (ref 150.0–400.0)
RBC: 4.18 Mil/uL (ref 3.87–5.11)
RDW: 13.2 % (ref 11.5–15.5)
WBC: 7.7 10*3/uL (ref 4.0–10.5)

## 2019-03-18 LAB — BASIC METABOLIC PANEL
BUN: 19 mg/dL (ref 6–23)
CO2: 28 mEq/L (ref 19–32)
Calcium: 8.9 mg/dL (ref 8.4–10.5)
Chloride: 103 mEq/L (ref 96–112)
Creatinine, Ser: 1.08 mg/dL (ref 0.40–1.20)
GFR: 48.59 mL/min — ABNORMAL LOW (ref >60.00)
Glucose, Bld: 123 mg/dL — ABNORMAL HIGH (ref 70–99)
Potassium: 3.4 mEq/L — ABNORMAL LOW (ref 3.5–5.1)
Sodium: 138 mEq/L (ref 135–145)

## 2019-03-18 LAB — LIPASE: Lipase: 45 U/L (ref 11.0–59.0)

## 2019-03-18 LAB — HEPATIC FUNCTION PANEL
ALT: 14 U/L (ref 0–35)
AST: 13 U/L (ref 0–37)
Albumin: 3.5 g/dL (ref 3.5–5.2)
Alkaline Phosphatase: 48 U/L (ref 39–117)
Bilirubin, Direct: 0.1 mg/dL (ref 0.0–0.3)
Total Bilirubin: 0.4 mg/dL (ref 0.2–1.2)
Total Protein: 6.5 g/dL (ref 6.0–8.3)

## 2019-03-18 MED ORDER — PANTOPRAZOLE SODIUM 40 MG PO TBEC: 40.0000 mg | DELAYED_RELEASE_TABLET | Freq: Every day | ORAL | 3 refills | Status: DC

## 2019-03-18 MED ORDER — ALBUTEROL SULFATE (2.5 MG/3ML) 0.083% IN NEBU: 2.5000 mg | INHALATION_SOLUTION | Freq: Four times a day (QID) | RESPIRATORY_TRACT | 12 refills | Status: DC | PRN

## 2019-03-18 NOTE — Patient Instructions (Signed)
Please remember to drink plenty of fluids over the next week  Please continue all other medications as before, and refills have been done if requested.  Please have the pharmacy call with any other refills you may need.  Please keep your appointments with your specialists as you may have planned  Please go to the LAB at the blood drawing area for the tests to be done  You will be contacted by phone if any changes need to be made immediately.  Otherwise, you will receive a letter about your results with an explanation, but please check with MyChart first.  Please remember to sign up for MyChart if you have not done so, as this will be important to you in the future with finding out test results, communicating by private email, and scheduling acute appointments online when needed.

## 2019-03-19 LAB — URINALYSIS, ROUTINE W REFLEX MICROSCOPIC
Bilirubin Urine: NEGATIVE
Glucose, UA: NEGATIVE
Hgb urine dipstick: NEGATIVE
Leukocytes,Ua: NEGATIVE
Nitrite: NEGATIVE
Protein, ur: NEGATIVE
Specific Gravity, Urine: 1.021 (ref 1.001–1.03)
pH: 5.5 (ref 5.0–8.0)

## 2019-03-19 LAB — URINE CULTURE

## 2019-03-20 ENCOUNTER — Encounter: Payer: Self-pay | Admitting: Internal Medicine

## 2019-03-20 NOTE — Assessment & Plan Note (Addendum)
Etiology unclear, for labs, stools tests, hold empiric antibx for now  I spent 31 minutes in preparing to see the patient by review of recent labs, imaging and procedures, obtaining and reviewing separately obtained history, communicating with the patient and family or caregiver, ordering medications, tests or procedures, and documenting clinical information in the EHR including the differential Dx, treatment, and any further evaluation and other management of diarrhea, abd pain, AKI, copd

## 2019-03-20 NOTE — Assessment & Plan Note (Signed)
Most likely related to reduced po intake, for increased po fluids, f/u lab

## 2019-03-20 NOTE — Assessment & Plan Note (Signed)
stable overall by history and exam, recent data reviewed with pt, and pt to continue medical treatment as before,  to f/u any worsening symptoms or concerns  

## 2019-03-20 NOTE — Assessment & Plan Note (Signed)
Consider ct

## 2019-03-20 NOTE — Progress Notes (Signed)
Subjective:    Patient ID: Desiree [name], female    DOB: 12/04/1935, 84 y.o.   MRN: [id number]  HPI  Here with recent onset recurring diarrheal stools, watery , multiple per day, assoc with mid and left lower abd discomfort but without fever, chills, n/v, pain radiation, wt loss, and Denies urinary symptoms such as dysuria, frequency, urgency, flank pain, hematuria or n/v, fever, chills.. though has had reduced appetite and recent lab with increased cr c/w AKI.  Denies worsening reflux,, dysphagia, n/v, or blood.   Past Medical History:  Diagnosis Date  . Anxiety 02/13/2017  . Asthma   . B12 deficiency anemia 02/13/2017  . Chronic back pain 02/13/2017  . Chronic cough 02/13/2017  . COPD (chronic obstructive pulmonary disease) ([place])   . Depression 02/13/2017  . Diverticulosis 02/13/2017  . Former smoker 2001  . GERD (gastroesophageal reflux disease) 02/13/2017  . HLD (hyperlipidemia) 02/13/2017  . HTN (hypertension) 02/13/2017  . Lumbar compression fracture ([place])   . Osteopenia 02/13/2017  . Spinal stenosis 02/13/2017   Past Surgical History:  Procedure Laterality Date  . APPENDECTOMY    . CATARACT EXTRACTION, BILATERAL    . KYPHOPLASTY  2010  . left hip surgury    . right hip surgury      reports that she has quit smoking. She has never used smokeless tobacco. She reports current alcohol use. She reports that she does not use drugs. family history includes Depression in her father; Lung cancer in her brother and sister; Mesothelioma in her son. Allergies  Allergen Reactions  . Levaquin [Levofloxacin] Hives  . Percocet [Oxycodone-Acetaminophen] Hives  . Azithromycin Rash   Current Outpatient Medications on File Prior to Visit  Medication Sig Dispense Refill  . citalopram (CELEXA) 20 MG tablet Take 1 tablet (20 mg total) by mouth daily. 90 tablet 0  . Fluticasone-Salmeterol (ADVAIR) 500-50 MCG/DOSE AEPB Inhale 1 puff into the lungs 2 (two) times a day. 180 each 3  .  hydrochlorothiazide (HYDRODIURIL) 25 MG tablet Take 1 tablet (25 mg total) by mouth daily. 90 tablet 0  . iron polysaccharides (NU-IRON) 150 MG capsule Take 1 capsule (150 mg total) by mouth daily. 90 capsule 1  . losartan (COZAAR) 100 MG tablet Take 1 tablet (100 mg total) by mouth daily. 90 tablet 3  . montelukast (SINGULAIR) 10 MG tablet Take 1 tablet (10 mg total) by mouth at bedtime. 90 tablet 1  . potassium chloride (KLOR-CON) 10 MEQ tablet Take 1 tablet (10 mEq total) by mouth daily. 90 tablet 3   No current facility-administered medications on file prior to visit.   Review of Systems All otherwise neg per pt     Objective:   Physical Exam  BP 130/70 (BP Location: Left Arm, Patient Position: Sitting, Cuff Size: Normal)   Pulse 89   Temp 97.9 F (36.6 C) (Oral)   Ht 5\' 3"  (1.6 m)   Wt 152 lb 4 oz (69.1 kg)   SpO2 98%   BMI 26.97 kg/m  VS noted,  Constitutional: Pt appears in NAD HENT: Head: NCAT.  Right Ear: External ear normal.  Left Ear: External ear normal.  Eyes: . Pupils are equal, round, and reactive to light. Conjunctivae and EOM are normal Nose: without d/c or deformity Neck: Neck supple. Gross normal ROM Cardiovascular: Normal rate and regular rhythm.   Pulmonary/Chest: Effort normal and breath sounds without rales or wheezing.  Abd:  Soft, ND, + BS, no organomegaly but with mid and LLQ mild  tender Neurological: Pt is alert. At baseline orientation, motor grossly intact Skin: Skin is warm. No rashes, other new lesions, no LE edema Psychiatric: Pt behavior is normal without agitation  All otherwise neg per pt Lab Results  Component Value Date   WBC 7.7 03/18/2019   HGB 12.7 03/18/2019   HCT 36.9 03/18/2019   PLT 285.0 03/18/2019   GLUCOSE 123 (H) 03/18/2019   CHOL 201 (H) 07/02/2018   TRIG 124.0 07/02/2018   HDL 61.40 07/02/2018   LDLCALC 115 (H) 07/02/2018   ALT 14 03/18/2019   AST 13 03/18/2019   NA 138 03/18/2019   K 3.4 (L) 03/18/2019   CL 103  03/18/2019   CREATININE 1.08 03/18/2019   BUN 19 03/18/2019   CO2 28 03/18/2019   TSH 1.42 07/02/2018   HGBA1C 6.2 07/02/2018      Assessment & Plan:

## 2019-03-21 ENCOUNTER — Telehealth: Payer: Self-pay

## 2019-03-21 ENCOUNTER — Ambulatory Visit: Payer: Medicare Other | Admitting: Family

## 2019-03-21 NOTE — Telephone Encounter (Signed)
Called patient's daughter Arline Asp. Informed her that currently Dr. Jonny Ruiz has not reviewed mom's results. Will call back asap when receive the information. Daughter said that patient is still experiencing same symptoms as was on 03-18-2019 appointment

## 2019-03-21 NOTE — Telephone Encounter (Signed)
Patient's daughter, Arline Asp, calling and would like a call back regarding recent lab work. Please advise.

## 2019-03-23 ENCOUNTER — Ambulatory Visit (INDEPENDENT_AMBULATORY_CARE_PROVIDER_SITE_OTHER): Payer: Medicare Other | Admitting: Internal Medicine

## 2019-03-23 ENCOUNTER — Telehealth: Payer: Self-pay | Admitting: Internal Medicine

## 2019-03-23 ENCOUNTER — Other Ambulatory Visit: Payer: Self-pay

## 2019-03-23 ENCOUNTER — Encounter: Payer: Self-pay | Admitting: Internal Medicine

## 2019-03-23 VITALS — BP 122/82 | HR 79 | Temp 97.7°F | Ht 63.0 in | Wt 153.0 lb

## 2019-03-23 DIAGNOSIS — J449 Chronic obstructive pulmonary disease, unspecified: Secondary | ICD-10-CM | POA: Diagnosis not present

## 2019-03-23 DIAGNOSIS — N179 Acute kidney failure, unspecified: Secondary | ICD-10-CM | POA: Diagnosis not present

## 2019-03-23 DIAGNOSIS — R197 Diarrhea, unspecified: Secondary | ICD-10-CM | POA: Diagnosis not present

## 2019-03-23 DIAGNOSIS — R1030 Lower abdominal pain, unspecified: Secondary | ICD-10-CM

## 2019-03-23 MED ORDER — DIPHENOXYLATE-ATROPINE 2.5-0.025 MG PO TABS: 1.0000 | ORAL_TABLET | Freq: Four times a day (QID) | ORAL | 0 refills | Status: DC | PRN

## 2019-03-23 MED ORDER — METRONIDAZOLE 500 MG PO TABS: 500.0000 mg | ORAL_TABLET | Freq: Three times a day (TID) | ORAL | 0 refills | Status: DC

## 2019-03-23 NOTE — Telephone Encounter (Signed)
Confirmed that Desiree [name] is on the [facility]. Spoke to [facility] and informed of labs. Desiree [name] asked if pt should still keep the appt for today. I informed Desiree [name] that she should.

## 2019-03-23 NOTE — Progress Notes (Signed)
Subjective:    Patient ID: Desiree [name], female    DOB: 05-03-1935, 84 y.o.   MRN: [id number]  HPI  Here to f/u with persistent symptoms not worse but just not improving with recurrent watery diarrheal stools per day, and not associated with fever, worsening abd pain, n/v and Denies worsening reflux, dysphagia or blood. Denies urinary symptoms such as dysuria, frequency, urgency, flank pain, hematuria or n/v, fever, chills.  Recent labs reassuring, but pt was not able to give stool study specimen until today now pending.  Pt denies chest pain, increased sob or doe, wheezing, orthopnea, PND, increased LE swelling, palpitations, dizziness or syncope.   Past Medical History:  Diagnosis Date  . Anxiety 02/13/2017  . Asthma   . B12 deficiency anemia 02/13/2017  . Chronic back pain 02/13/2017  . Chronic cough 02/13/2017  . COPD (chronic obstructive pulmonary disease) ([facility])   . Depression 02/13/2017  . Diverticulosis 02/13/2017  . Former smoker 2001  . GERD (gastroesophageal reflux disease) 02/13/2017  . HLD (hyperlipidemia) 02/13/2017  . HTN (hypertension) 02/13/2017  . Lumbar compression fracture ([facility])   . Osteopenia 02/13/2017  . Spinal stenosis 02/13/2017   Past Surgical History:  Procedure Laterality Date  . APPENDECTOMY    . CATARACT EXTRACTION, BILATERAL    . KYPHOPLASTY  2010  . left hip surgury    . right hip surgury      reports that she has quit smoking. She has never used smokeless tobacco. She reports current alcohol use. She reports that she does not use drugs. family history includes Depression in her father; Lung cancer in her brother and sister; Mesothelioma in her son. Allergies  Allergen Reactions  . Levaquin [Levofloxacin] Hives  . Percocet [Oxycodone-Acetaminophen] Hives  . Azithromycin Rash   Current Outpatient Medications on File Prior to Visit  Medication Sig Dispense Refill  . albuterol (PROVENTIL) (2.5 MG/3ML) 0.083% nebulizer solution Take 3 mLs (2.5 mg total) by  nebulization every 6 (six) hours as needed for wheezing or shortness of breath. 75 mL 12  . citalopram (CELEXA) 20 MG tablet Take 1 tablet (20 mg total) by mouth daily. 90 tablet 0  . Fluticasone-Salmeterol (ADVAIR) 500-50 MCG/DOSE AEPB Inhale 1 puff into the lungs 2 (two) times a day. 180 each 3  . hydrochlorothiazide (HYDRODIURIL) 25 MG tablet Take 1 tablet (25 mg total) by mouth daily. 90 tablet 0  . iron polysaccharides (NU-IRON) 150 MG capsule Take 1 capsule (150 mg total) by mouth daily. 90 capsule 1  . losartan (COZAAR) 100 MG tablet Take 1 tablet (100 mg total) by mouth daily. 90 tablet 3  . montelukast (SINGULAIR) 10 MG tablet Take 1 tablet (10 mg total) by mouth at bedtime. 90 tablet 1  . pantoprazole (PROTONIX) 40 MG tablet Take 1 tablet (40 mg total) by mouth daily. 90 tablet 3  . potassium chloride (KLOR-CON) 10 MEQ tablet Take 1 tablet (10 mEq total) by mouth daily. 90 tablet 3   No current facility-administered medications on file prior to visit.   Review of Systems All otherwise neg per pt     Objective:   Physical Exam BP 122/82 (BP Location: Right Arm, Patient Position: Sitting, Cuff Size: Normal)   Pulse 79   Temp 97.7 F (36.5 C) (Oral)   Ht 5\' 3"  (1.6 m)   Wt 153 lb (69.4 kg)   SpO2 96%   BMI 27.10 kg/m  VS noted, non toxic Constitutional: Pt appears in NAD HENT: Head: NCAT.  Right  Ear: External ear normal.  Left Ear: External ear normal.  Eyes: . Pupils are equal, round, and reactive to light. Conjunctivae and EOM are normal Nose: without d/c or deformity Neck: Neck supple. Gross normal ROM Cardiovascular: Normal rate and regular rhythm.   Pulmonary/Chest: Effort normal and breath sounds without rales or wheezing.  Abd:  Soft, ND, + BS, no organomegaly with cont'd low mid and left abd tender but seems less to me and no guarding/no rebound Neurological: Pt is alert. At baseline orientation, motor grossly intact Skin: Skin is warm. No rashes, other new  lesions, no LE edema Psychiatric: Pt behavior is normal without agitation  All otherwise neg per pt Lab Results  Component Value Date   WBC 7.7 03/18/2019   HGB 12.7 03/18/2019   HCT 36.9 03/18/2019   PLT 285.0 03/18/2019   GLUCOSE 123 (H) 03/18/2019   CHOL 201 (H) 07/02/2018   TRIG 124.0 07/02/2018   HDL 61.40 07/02/2018   LDLCALC 115 (H) 07/02/2018   ALT 14 03/18/2019   AST 13 03/18/2019   NA 138 03/18/2019   K 3.4 (L) 03/18/2019   CL 103 03/18/2019   CREATININE 1.08 03/18/2019   BUN 19 03/18/2019   CO2 28 03/18/2019   TSH 1.42 07/02/2018   HGBA1C 6.2 07/02/2018          Assessment & Plan:

## 2019-03-23 NOTE — Addendum Note (Signed)
Addended by: Radford Pax M on: 03/23/2019 04:05 PM   Modules accepted: Orders

## 2019-03-23 NOTE — Patient Instructions (Signed)
Please take all new medication as prescribed - the flagyl antibiotic  Please take all new medication as prescribed - the lomotil for diarrhea if needed  Please continue all other medications as before, and refills have been done if requested.  Please have the pharmacy call with any other refills you may need.  Please keep your appointments with your specialists as you may have planned  The Stool testing is pending, so we will need to call with results soon

## 2019-03-23 NOTE — Telephone Encounter (Signed)
New Message:   Pt's daughter is calling and states she would like to know if the results are back of her test and she also states they will be unable to bring in stool sample because the patient has not had a bowel movement in the last 3 days. Please advise.

## 2019-03-23 NOTE — Telephone Encounter (Signed)
Sorry the results were released to mychart  With:  The test results show that your current treatment is OK, as the tests are stable.  The potassium is slightly low but should improve with the potassium medication.  The stool test results are pending.    Ok to send labs in mail if they want this

## 2019-03-27 ENCOUNTER — Encounter: Payer: Self-pay | Admitting: Internal Medicine

## 2019-03-27 NOTE — Assessment & Plan Note (Signed)
stable overall by history and exam, recent data reviewed with pt, and pt to continue medical treatment as before,  to f/u any worsening symptoms or concerns  

## 2019-03-27 NOTE — Assessment & Plan Note (Addendum)
Etiology unclear, stool studies now given and pending, for empiric flagyl asd, and lomotil prn, f/u stool studies  I spent 31 minutes in preparing to see the patient by review of recent labs, imaging and procedures, obtaining and reviewing separately obtained history, communicating with the patient and family or caregiver, ordering medications, tests or procedures, and documenting clinical information in the EHR including the differential Dx, treatment, and any further evaluation and other management of diarrhea, low bad pain, copd, AKI

## 2019-03-27 NOTE — Assessment & Plan Note (Signed)
Improved, cont oral fluids

## 2019-03-27 NOTE — Assessment & Plan Note (Signed)
I think somewhat improved, recent UA neg, to continue to monitor

## 2019-03-30 LAB — OVA AND PARASITE EXAMINATION: CONCENTRATE RESULT:: NONE SEEN

## 2019-03-30 LAB — TIQ- AMBIGUOUS ORDER: SPECIMEN(S) SUBMITTED: 2

## 2019-03-30 LAB — C. DIFFICILE GDH AND TOXIN A/B
GDH ANTIGEN: NOT DETECTED
TOXIN A AND B: NOT DETECTED

## 2019-04-06 ENCOUNTER — Telehealth: Payer: Self-pay | Admitting: Internal Medicine

## 2019-04-06 NOTE — Telephone Encounter (Signed)
New message:   Pt's daughter is calling for some results. She ask that we call her back instead of calling the patient. She also states she is heading back to class and to please leave a detailed voicemail and let her know what needs to be done if anything. Please advise.

## 2019-04-07 NOTE — Telephone Encounter (Signed)
Ok to ask daughter if she still wants the gi panel done, as the lab we sent apparently no longer does this test

## 2019-04-07 NOTE — Telephone Encounter (Signed)
I had the lab look into the results. [facility] canceled the GI panel stating that they do not do that test any longer.   Mychart message with the results that we did get back have been read.

## 2019-04-08 ENCOUNTER — Telehealth: Payer: Self-pay | Admitting: Internal Medicine

## 2019-04-08 MED ORDER — FLUTICASONE-SALMETEROL 500-50 MCG/DOSE IN AEPB: 1.0000 | INHALATION_SPRAY | Freq: Two times a day (BID) | RESPIRATORY_TRACT | 3 refills | Status: DC

## 2019-04-08 MED ORDER — CITALOPRAM HYDROBROMIDE 20 MG PO TABS: 20.0000 mg | ORAL_TABLET | Freq: Every day | ORAL | 3 refills | Status: DC

## 2019-04-08 NOTE — Telephone Encounter (Signed)
New message:   1.Medication Requested: citalopram (CELEXA) 20 MG tablet Fluticasone-Salmeterol (ADVAIR) 500-50 MCG/DOSE AEPB 2. Pharmacy (Name, Street, [place]): [facility] - [place], [place] - [address] 3. On Med List: Yes  4. Last Visit with PCP: 03/23/19  5. Next visit date with PCP:   Agent: Please be advised that RX refills may take up to 3 business days. We ask that you follow-up with your pharmacy.

## 2019-04-12 ENCOUNTER — Telehealth: Payer: Self-pay

## 2019-04-12 DIAGNOSIS — J45909 Unspecified asthma, uncomplicated: Secondary | ICD-10-CM

## 2019-04-12 DIAGNOSIS — J449 Chronic obstructive pulmonary disease, unspecified: Secondary | ICD-10-CM

## 2019-04-12 NOTE — Telephone Encounter (Signed)
1.Medication Requested:Fluticasone-Salmeterol (ADVAIR) 500-50 MCG/DOSE AEPB- patient thought Rx was 250 mcg   2. Pharmacy (Name, Street, [place]): [facility] - [place], [place] - [address]  3. On Med List: yes  4. Last Visit with PCP: 3.3.21  5. Next visit date with PCP: no appt is made at this time     Agent: Please be advised that RX refills may take up to 3 business days. We ask that you follow-up with your pharmacy.

## 2019-04-14 MED ORDER — FLUTICASONE-SALMETEROL 500-50 MCG/DOSE IN AEPB: 1.0000 | INHALATION_SPRAY | Freq: Two times a day (BID) | RESPIRATORY_TRACT | 3 refills | Status: DC

## 2019-04-14 NOTE — Telephone Encounter (Signed)
erx has been sent as requested.  

## 2019-04-29 ENCOUNTER — Telehealth: Payer: Self-pay | Admitting: Internal Medicine

## 2019-04-29 MED ORDER — FLUTICASONE-SALMETEROL 500-50 MCG/DOSE IN AEPB: 1.0000 | INHALATION_SPRAY | Freq: Two times a day (BID) | RESPIRATORY_TRACT | 3 refills | Status: DC

## 2019-04-29 NOTE — Telephone Encounter (Signed)
Per chart refill has been filled this am../lm,b

## 2019-04-29 NOTE — Addendum Note (Signed)
Addended by: Delsa Grana R on: 04/29/2019 11:39 AM   Modules accepted: Orders

## 2019-04-29 NOTE — Telephone Encounter (Signed)
New message:   1.Medication Requested: Fluticasone-Salmeterol (ADVAIR) 500-50 MCG/DOSE AEPB 2. Pharmacy (Name, Street, [place]): [facility] - [place], [place] - [address] 3. On Med List: Yes  4. Last Visit with PCP:   5. Next visit date with PCP:  Pt states she was sent 500 mcg and she currently takes 250 and has been taking this dosage for a while. She ask that a new [facility] be [facility] Agent: Please be advised that RX refills may take up to 3 business days. We ask that you follow-up with your pharmacy.

## 2019-05-05 MED ORDER — FLUTICASONE-SALMETEROL 250-50 MCG/DOSE IN AEPB: 1.0000 | INHALATION_SPRAY | Freq: Two times a day (BID) | RESPIRATORY_TRACT | 3 refills | Status: AC

## 2019-05-05 NOTE — Telephone Encounter (Signed)
The 250 is done erx

## 2019-05-05 NOTE — Telephone Encounter (Signed)
Please advise this message 

## 2019-05-05 NOTE — Addendum Note (Signed)
Addended by: Corwin Levins on: 05/05/2019 09:59 PM   Modules accepted: Orders

## 2019-05-05 NOTE — Telephone Encounter (Signed)
New message:   Pt is calling and states she needs a new prescription sent for 250 not 500. Pt states she has not taken 500 mg in over a year. Please advise.

## 2019-05-09 ENCOUNTER — Telehealth: Payer: Self-pay

## 2019-05-09 NOTE — Telephone Encounter (Signed)
Patient is covered by multiple plans. Please call us at [phone].  Spoke with representative for [employer] at [phone]. Representative stated pt has to call [phone] to verify her insurance.  Contacted pt with this information and she will be contacting the company.

## 2019-05-19 ENCOUNTER — Telehealth: Payer: Self-pay | Admitting: Internal Medicine

## 2019-05-19 NOTE — Telephone Encounter (Signed)
New message:   1.Medication Requested: montelukast (SINGULAIR) 10 MG tablet hydrochlorothiazide (HYDRODIURIL) 25 MG tablet losartan (COZAAR) 100 MG tablet  2. Pharmacy (Name, Street, [place]): [facility] - [place], [place] - [address] 3. On Med List: Yes  4. Last Visit with PCP: 03/23/19  5. Next visit date with PCP: none   Agent: Please be advised that RX refills may take up to 3 business days. We ask that you follow-up with your pharmacy.

## 2019-05-20 ENCOUNTER — Other Ambulatory Visit: Payer: Self-pay

## 2019-05-20 DIAGNOSIS — J45909 Unspecified asthma, uncomplicated: Secondary | ICD-10-CM

## 2019-05-20 MED ORDER — MONTELUKAST SODIUM 10 MG PO TABS: 10.0000 mg | ORAL_TABLET | Freq: Every day | ORAL | 1 refills | Status: DC

## 2019-05-25 ENCOUNTER — Telehealth: Payer: Self-pay

## 2019-05-25 MED ORDER — LOSARTAN POTASSIUM 100 MG PO TABS: 100.0000 mg | ORAL_TABLET | Freq: Every day | ORAL | 3 refills | Status: DC

## 2019-05-25 NOTE — Telephone Encounter (Signed)
1.Medication Requested:losartan (COZAAR) 100 MG tablet  2. Pharmacy (Name, Street, [place]):[facility] - [place], [place] - [address]  3. On Med List:  Yes   4. Last Visit with PCP: 3.3.2021  5. Next visit date with PCP: no appt is made at this time    Agent: Please be advised that RX refills may take up to 3 business days. We ask that you follow-up with your pharmacy.

## 2019-05-25 NOTE — Telephone Encounter (Signed)
Reviewed chart pt is up-to-date [facility].../lmb  

## 2019-05-26 ENCOUNTER — Other Ambulatory Visit: Payer: Self-pay | Admitting: Internal Medicine

## 2019-05-26 NOTE — Telephone Encounter (Signed)
Please refill as per office routine med refill policy (all routine meds refilled for 3 mo or monthly per pt preference up to one year from last visit, then month to month grace period for 3 mo, then further med refills will have to be denied)  

## 2019-07-29 ENCOUNTER — Encounter: Payer: Self-pay | Admitting: Family Medicine

## 2019-07-29 ENCOUNTER — Ambulatory Visit (INDEPENDENT_AMBULATORY_CARE_PROVIDER_SITE_OTHER): Payer: Medicare Other | Admitting: Family Medicine

## 2019-07-29 ENCOUNTER — Other Ambulatory Visit: Payer: Self-pay

## 2019-07-29 ENCOUNTER — Ambulatory Visit: Payer: Self-pay

## 2019-07-29 VITALS — BP 102/70 | HR 76 | Ht 63.0 in | Wt 158.0 lb

## 2019-07-29 DIAGNOSIS — G8929 Other chronic pain: Secondary | ICD-10-CM

## 2019-07-29 DIAGNOSIS — R202 Paresthesia of skin: Secondary | ICD-10-CM | POA: Diagnosis not present

## 2019-07-29 DIAGNOSIS — M25561 Pain in right knee: Secondary | ICD-10-CM

## 2019-07-29 MED ORDER — GABAPENTIN 100 MG PO CAPS: 100.0000 mg | ORAL_CAPSULE | Freq: Every day | ORAL | 3 refills | Status: DC

## 2019-07-29 NOTE — Patient Instructions (Signed)
Thank you for coming in today. Plan for injection today.  Also start gabapentin for foot numb tingly sensation.  Take 1-3 pills at bedtime as needed for nerve pain.   Recheck with me in about 1 month.    Paresthesia Paresthesia is a burning or prickling feeling. This feeling can happen in any part of the body. It often happens in the hands, arms, legs, or feet. Usually, it is not painful. In most cases, the feeling goes away in a short time and is not a sign of a serious problem. If you have paresthesia that lasts a long time, you may need to be seen by your doctor. Follow these instructions at home: Alcohol use   Do not drink alcohol if: ? Your doctor tells you not to drink. ? You are pregnant, may be pregnant, or are planning to become pregnant.  If you drink alcohol: ? Limit how much you use to:  0-1 drink a day for women.  0-2 drinks a day for men. ? Be aware of how much alcohol is in your drink. In the [place] one drink equals one 12 oz bottle of beer (355 mL), one 5 oz glass of wine (148 mL), or one 1 oz glass of hard liquor (44 mL). Nutrition   Eat a healthy diet. This includes: ? Eating foods that have a lot of fiber in them, such as fresh fruits and vegetables, whole grains, and beans. ? Limiting foods that have a lot of fat and processed sugars in them, such as fried or sweet foods. General instructions  Take over-the-counter and prescription medicines only as told by your doctor.  Do not use any products that have nicotine or tobacco in them, such as cigarettes and e-cigarettes. If you need help quitting, ask your doctor.  If you have diabetes, work with your doctor to make sure your blood sugar stays in a healthy range.  If your feet feel numb: ? Check for redness, warmth, and swelling every day. ? Wear padded socks and comfortable shoes. These help protect your feet.  Keep all follow-up visits as told by your doctor. This is important. Contact a doctor  if:  You have paresthesia that gets worse or does not go away.  Your burning or prickling feeling gets worse when you walk.  You have pain or cramps.  You feel dizzy.  You have a rash. Get help right away if you:  Feel weak.  Have trouble walking or moving.  Have problems speaking, understanding, or seeing.  Feel confused.  Cannot control when you pee (urinate) or poop (have a bowel movement).  Lose feeling (have numbness) after an injury.  Have new weakness in an arm or leg.  Pass out (faint). Summary  Paresthesia is a burning or prickling feeling. It often happens in the hands, arms, legs, or feet.  In most cases, the feeling goes away in a short time and is not a sign of a serious problem.  If you have paresthesia that lasts a long time, you may need to be seen by your doctor. This information is not intended to replace advice given to you by your health care provider. Make sure you discuss any questions you have with your health care provider. Document Revised: 02/01/2018 Document Reviewed: 01/15/2017 Elsevier Patient Education  2020 [employer].

## 2019-07-29 NOTE — Progress Notes (Signed)
02/24/2019 84 y.o. female with bilateral knee pain and discomfort. Patient has degenerative changes seen on right knee x-ray in December as well as on Dr. Michaelle Copas ultrasound in early December.  This is the predominant driving factor for her knee effusion and Baker's cyst and knee pain. Today she has only a tiny Baker's cyst certainly small enough that aspirating the Baker's cyst does not worth it. Plan to proceed with trial of steroid injection again.  Emphasized the importance of Voltaren gel and compressive knee sleeve. If not sufficient or if symptoms return may proceed with hyaluronic acid injections.  Discussed this with patient and her family and provided handout.  Additionally PRP injections in the future may be helpful as well.  Update 07/29/2019 Desiree [name] is a 84 y.o. female who presents to [facility] at [facility] today for bilateral knee pain. Patient states her right knee is still painful. States she is not sure if she is making any progress.   She also notes that her bilateral feet have numbness and tingling especially at bedtime.  She notes symptoms are diffuse across both feet.  She not had much work-up or treatment for this.  She does have a history of B12 deficiency but when her B12 was checked last year it was normal.  Pertinent review of systems: No fevers or chills  Relevant historical information: History lumbar compression fracture and B12 deficiency   Exam:  BP 102/70 (BP Location: Left Arm, Patient Position: Sitting)   Pulse 76   Ht 5\' 3"  (1.6 m)   Wt 158 lb (71.7 kg)   SpO2 95%   BMI 27.99 kg/m  General: Well Developed, well nourished, and in no acute distress.   MSK: Right knee small effusion otherwise normal-appearing Normal motion with crepitation stable ligamentous exam.  Bilaterally normal-appearing with normal sensation to light touch.  Pulses and capillary fill are intact.    Lab and Radiology Results  Procedure:  Real-time Ultrasound Guided Injection of right knee superior lateral patellar space Device: Philips Affiniti 50G Images permanently stored and available for review in the ultrasound unit. Verbal informed consent obtained.  Discussed risks and benefits of procedure. Warned about infection bleeding damage to structures skin hypopigmentation and fat atrophy among others. Patient expresses understanding and agreement Time-out conducted.   Noted no overlying erythema, induration, or other signs of local infection.   Quick look with ultrasound probe shows a small Baker's cyst. Skin prepped in a sterile fashion.   Local anesthesia: Topical Ethyl chloride.   With sterile technique and under real time ultrasound guidance:  40 mg of Kenalog and 2 mL of Marcaine injected easily.   Completed without difficulty   Pain immediately resolved suggesting accurate placement of the medication.   Advised to call if fevers/chills, erythema, induration, drainage, or persistent bleeding.   Images permanently stored and available for review in the ultrasound unit.  Impression: Technically successful ultrasound guided injection.  0    Assessment and Plan: 84 y.o. female with right knee pain due to DJD.  She does have a small Baker's cyst but I do not think that is contributing to her pain today.  Fundamentally she is getting for 5 months of pain relief from steroid injections.  I think is reasonable to continue steroid injections until they do not work anymore.  Could consider hyaluronic acid injections as well.  Discussed that ultimately she may need a knee replacement.  She would like to try to continue doing injections  to delay knee replacement if possible.  Bilateral paresthesias.  Unclear etiology her symptoms do not correspond to one dermatomal pattern indicating that her symptoms are probably peripheral neuropathy.  I discussed her options.  Ideally nerve conduction study would be a good test to do at this  point however that is going to be uncomfortable and she would like to try some treatment first.  We will try low-dose gabapentin at bedtime and if not sufficient will proceed with nerve conduction study.  Recheck back in 1 month.    PDMP not reviewed this encounter. Orders Placed This Encounter  Procedures  . [place] LIMITED JOINT SPACE STRUCTURES LOW RIGHT(NO LINKED CHARGES)    Order Specific Question:   Reason for Exam (SYMPTOM  OR DIAGNOSIS REQUIRED)    Answer:   right knee pain    Order Specific Question:   Preferred imaging location?    Answer:   Internal   Meds ordered this encounter  Medications  . gabapentin (NEURONTIN) 100 MG capsule    Sig: Take 1-3 capsules (100-300 mg total) by mouth at bedtime. For nerve pain    Dispense:  30 capsule    Refill:  3     Discussed warning signs or symptoms. Please see discharge instructions. Patient expresses understanding.   The above documentation has been reviewed and is accurate and complete Clementeen Graham, M.D.

## 2019-09-01 ENCOUNTER — Ambulatory Visit (INDEPENDENT_AMBULATORY_CARE_PROVIDER_SITE_OTHER): Payer: Medicare Other | Admitting: Family Medicine

## 2019-09-01 ENCOUNTER — Other Ambulatory Visit: Payer: Self-pay

## 2019-09-01 ENCOUNTER — Encounter: Payer: Self-pay | Admitting: Family Medicine

## 2019-09-01 ENCOUNTER — Ambulatory Visit (INDEPENDENT_AMBULATORY_CARE_PROVIDER_SITE_OTHER)
Admission: RE | Admit: 2019-09-01 | Discharge: 2019-09-01 | Disposition: A | Discharge status: Home / Self Care | Payer: Medicare Other | Source: Ambulatory Visit | Attending: Family Medicine | Admitting: Family Medicine

## 2019-09-01 VITALS — BP 120/70 | HR 72 | Ht 63.0 in | Wt 161.2 lb

## 2019-09-01 DIAGNOSIS — M545 Low back pain, unspecified: Secondary | ICD-10-CM

## 2019-09-01 DIAGNOSIS — R600 Localized edema: Secondary | ICD-10-CM

## 2019-09-01 DIAGNOSIS — R202 Paresthesia of skin: Secondary | ICD-10-CM

## 2019-09-01 DIAGNOSIS — I700 Atherosclerosis of aorta: Secondary | ICD-10-CM | POA: Diagnosis not present

## 2019-09-01 DIAGNOSIS — M4319 Spondylolisthesis, multiple sites in spine: Secondary | ICD-10-CM | POA: Diagnosis not present

## 2019-09-01 NOTE — Progress Notes (Signed)
I, Christoper Fabian, LAT, ATC, am serving as scribe for Dr. Clementeen Graham.  Desiree [name] is a 84 y.o. female who presents to [facility] at [facility] today for f/u of B knee pain and B feet numbness/tingling.  She was last seen by Dr. Denyse Amass on 07/29/19 and had a R knee injection.  She was also prescribed Gabapentin to help w/ her feet paresthesias.  Since her last visit, pt reports that her R knee pain is improved but feels like her B foot pain has not changed.  She reports new swelling in her B feet/ankles.  She also notes persistent back pain with ambulation.  Pain is been worsening recently.  She denies radiating pain down her legs but does as noted above have some numbness and tingling into her feet bilaterally.  Diagnostic imaging: R knee XR- 12/21/18  Pertinent review of systems: No fevers or chills  Relevant historical information: History lumbar compression fracture and back surgery.   Exam:  BP 120/70 (BP Location: Left Arm, Patient Position: Sitting, Cuff Size: Normal)   Pulse 72   Ht 5\' 3"  (1.6 m)   Wt 161 lb 3.2 oz (73.1 kg)   SpO2 97%   BMI 28.56 kg/m  General: Well Developed, well nourished, and in no acute distress.   MSK: L-spine normal-appearing nontender midline.  Decreased lumbar motion.  Mild antalgic gait. Bilateral lower extremities 1+ pitting edema.  No significant ankle effusion.  Normal ankle foot motion. Pulses and capillary fill are intact distally in the feet bilaterally. Slight decreased sensation plantar feet to light touch.    Lab and Radiology Results L-spine x-ray ordered today will be done in near future.   Assessment and Plan: 84 y.o. female with  Bilateral lower extremity paresthesia.  Does not correspond to any dermatomal pattern.  Likely peripheral neuropathy.  Failure to improve with gabapentin significantly.  Plan for nerve conduction study.  Recheck after nerve conduction study.  1+ pitting edema bilateral lower  extremities.  Recommend compression stockings and follow-up with PCP as needed.  Chronic back pain acute exacerbation.  Plan for x-ray in the near future.  We will arrange for home health physical therapy visit should be helpful.  Again recheck back in after nerve conduction study which should be about a month from now.   PDMP not reviewed this encounter. Orders Placed This Encounter  Procedures  . DG Lumbar Spine 2-3 Views    Standing Status:   Future    Standing Expiration Date:   08/31/2020    Order Specific Question:   Reason for Exam (SYMPTOM  OR DIAGNOSIS REQUIRED)    Answer:   eval low back pain and possible radiculopathy    Order Specific Question:   Preferred imaging location?    Answer:   10/31/2020    Order Specific Question:   Radiology Contrast Protocol - do NOT remove file path    Answer:   [email or web address]  . Ambulatory referral to Neurology    Referral Priority:   Routine    Referral Type:   Consultation    Referral Reason:   Specialty Services Required    Requested Specialty:   Neurology    Number of Visits Requested:   1  . Ambulatory referral to Home Health    Referral Priority:   Routine    Referral Type:   Home Health Care    Referral Reason:   Specialty Services Required    Requested Specialty:   Home  Health Services    Number of Visits Requested:   1  . NCV with EMG(electromyography)    Standing Status:   Future    Standing Expiration Date:   08/31/2020    Order Specific Question:   Where should this test be performed?    Answer:   [facility]   No orders of the defined types were placed in this encounter.    Discussed warning signs or symptoms. Please see discharge instructions. Patient expresses understanding.   The above documentation has been reviewed and is accurate and complete Clementeen Graham, M.D.

## 2019-09-01 NOTE — Patient Instructions (Addendum)
Thank you for coming in today.  Plan for nerve conduction study.   Try compression stockings during the day.   Recheck about 1-2 weeks after the nerve study.   Try to increase the gabapentin to 3 pills (300mg ).   Get xray.  Plan for home health PT   Peripheral Neuropathy Peripheral neuropathy is a type of nerve damage. It affects nerves that carry signals between the spinal cord and the arms, legs, and the rest of the body (peripheral nerves). It does not affect nerves in the spinal cord or brain. In peripheral neuropathy, one nerve or a group of nerves may be damaged. Peripheral neuropathy is a broad category that includes many specific nerve disorders, like diabetic neuropathy, hereditary neuropathy, and carpal tunnel syndrome. What are the causes? This condition may be caused by:  Diabetes. This is the most common cause of peripheral neuropathy.  Nerve injury.  Pressure or stress on a nerve that lasts a long time.  Lack (deficiency) of B vitamins. This can result from alcoholism, poor diet, or a restricted diet.  Infections.  Autoimmune diseases, such as rheumatoid arthritis and systemic lupus erythematosus.  Nerve diseases that are passed from parent to child (inherited).  Some medicines, such as cancer medicines (chemotherapy).  Poisonous (toxic) substances, such as lead and mercury.  Too little blood flowing to the legs.  Kidney disease.  Thyroid disease. In some cases, the cause of this condition is not known. What are the signs or symptoms? Symptoms of this condition depend on which of your nerves is damaged. Common symptoms include:  Loss of feeling (numbness) in the feet, hands, or both.  Tingling in the feet, hands, or both.  Burning pain.  Very sensitive skin.  Weakness.  Not being able to move a part of the body (paralysis).  Muscle twitching.  Clumsiness or poor coordination.  Loss of balance.  Not being able to control your  bladder.  Feeling dizzy.  Sexual problems. How is this diagnosed? Diagnosing and finding the cause of peripheral neuropathy can be difficult. Your health care provider will take your medical history and do a physical exam. A neurological exam will also be done. This involves checking things that are affected by your brain, spinal cord, and nerves (nervous system). For example, your health care provider will check your reflexes, how you move, and what you can feel. You may have other tests, such as:  Blood tests.  Electromyogram (EMG) and nerve conduction tests. These tests check nerve function and how well the nerves are controlling the muscles.  Imaging tests, such as CT scans or MRI to rule out other causes of your symptoms.  Removing a small piece of nerve to be examined in a lab (nerve biopsy). This is rare.  Removing and examining a small amount of the fluid that surrounds the brain and spinal cord (lumbar puncture). This is rare. How is this treated? Treatment for this condition may involve:  Treating the underlying cause of the neuropathy, such as diabetes, kidney disease, or vitamin deficiencies.  Stopping medicines that can cause neuropathy, such as chemotherapy.  Medicine to relieve pain. Medicines may include: ? Prescription or over-the-counter pain medicine. ? Antiseizure medicine. ? Antidepressants. ? Pain-relieving patches that are applied to painful areas of skin.  Surgery to relieve pressure on a nerve or to destroy a nerve that is causing pain.  Physical therapy to help improve movement and balance.  Devices to help you move around (assistive devices). Follow these instructions at  home: Medicines  Take over-the-counter and prescription medicines only as told by your health care provider. Do not take any other medicines without first asking your health care provider.  Do not drive or use heavy machinery while taking prescription pain  medicine. Lifestyle   Do not use any products that contain nicotine or tobacco, such as cigarettes and e-cigarettes. Smoking keeps blood from reaching damaged nerves. If you need help quitting, ask your health care provider.  Avoid or limit alcohol. Too much alcohol can cause a vitamin B deficiency, and vitamin B is needed for healthy nerves.  Eat a healthy diet. This includes: ? Eating foods that are high in fiber, such as fresh fruits and vegetables, whole grains, and beans. ? Limiting foods that are high in fat and processed sugars, such as fried or sweet foods. General instructions   If you have diabetes, work closely with your health care provider to keep your blood sugar under control.  If you have numbness in your feet: ? Check every day for signs of injury or infection. Watch for redness, warmth, and swelling. ? Wear padded socks and comfortable shoes. These help protect your feet.  Develop a good support system. Living with peripheral neuropathy can be stressful. Consider talking with a mental health specialist or joining a support group.  Use assistive devices and attend physical therapy as told by your health care provider. This may include using a walker or a cane.  Keep all follow-up visits as told by your health care provider. This is important. Contact a health care provider if:  You have new signs or symptoms of peripheral neuropathy.  You are struggling emotionally from dealing with peripheral neuropathy.  Your pain is not well-controlled. Get help right away if:  You have an injury or infection that is not healing normally.  You develop new weakness in an arm or leg.  You fall frequently. Summary  Peripheral neuropathy is when the nerves in the arms, or legs are damaged, resulting in numbness, weakness, or pain.  There are many causes of peripheral neuropathy, including diabetes, pinched nerves, vitamin deficiencies, autoimmune disease, and hereditary  conditions.  Diagnosing and finding the cause of peripheral neuropathy can be difficult. Your health care provider will take your medical history, do a physical exam, and do tests, including blood tests and nerve function tests.  Treatment involves treating the underlying cause of the neuropathy and taking medicines to help control pain. Physical therapy and assistive devices may also help. This information is not intended to replace advice given to you by your health care provider. Make sure you discuss any questions you have with your health care provider. Document Revised: 12/19/2016 Document Reviewed: 03/17/2016 Elsevier Patient Education  2020 [employer].

## 2019-09-02 ENCOUNTER — Encounter: Payer: Self-pay | Admitting: Family Medicine

## 2019-09-02 DIAGNOSIS — M4306 Spondylolysis, lumbar region: Secondary | ICD-10-CM

## 2019-09-02 HISTORY — DX: Spondylolysis, lumbar region: M43.06

## 2019-09-02 NOTE — Progress Notes (Signed)
X-ray shows some arthritis in the back as well as pars defects that allow the vertebrae to shift a bit.  If nerve conduction study is not helpful we will proceed with MRI of the lumbar spine to further evaluate this.  Physical therapy should help this condition.

## 2019-09-09 DIAGNOSIS — M545 Low back pain: Secondary | ICD-10-CM | POA: Diagnosis not present

## 2019-09-09 DIAGNOSIS — F329 Major depressive disorder, single episode, unspecified: Secondary | ICD-10-CM | POA: Diagnosis not present

## 2019-09-09 DIAGNOSIS — Z9181 History of falling: Secondary | ICD-10-CM | POA: Diagnosis not present

## 2019-09-09 DIAGNOSIS — J449 Chronic obstructive pulmonary disease, unspecified: Secondary | ICD-10-CM | POA: Diagnosis not present

## 2019-09-09 DIAGNOSIS — F419 Anxiety disorder, unspecified: Secondary | ICD-10-CM | POA: Diagnosis not present

## 2019-09-09 DIAGNOSIS — R202 Paresthesia of skin: Secondary | ICD-10-CM | POA: Diagnosis not present

## 2019-09-09 DIAGNOSIS — I10 Essential (primary) hypertension: Secondary | ICD-10-CM | POA: Diagnosis not present

## 2019-09-09 DIAGNOSIS — E538 Deficiency of other specified B group vitamins: Secondary | ICD-10-CM | POA: Diagnosis not present

## 2019-09-09 DIAGNOSIS — M1711 Unilateral primary osteoarthritis, right knee: Secondary | ICD-10-CM | POA: Diagnosis not present

## 2019-09-09 DIAGNOSIS — Z8781 Personal history of (healed) traumatic fracture: Secondary | ICD-10-CM | POA: Diagnosis not present

## 2019-09-09 DIAGNOSIS — M4800 Spinal stenosis, site unspecified: Secondary | ICD-10-CM | POA: Diagnosis not present

## 2019-09-09 DIAGNOSIS — R609 Edema, unspecified: Secondary | ICD-10-CM | POA: Diagnosis not present

## 2019-09-09 DIAGNOSIS — G8929 Other chronic pain: Secondary | ICD-10-CM | POA: Diagnosis not present

## 2019-09-23 ENCOUNTER — Telehealth: Payer: Self-pay | Admitting: Family Medicine

## 2019-09-23 NOTE — Telephone Encounter (Signed)
FYI: Dionka from [facility] called stating that the patient had a missed visit due to increased pain.

## 2019-10-09 DIAGNOSIS — M4800 Spinal stenosis, site unspecified: Secondary | ICD-10-CM | POA: Diagnosis not present

## 2019-10-09 DIAGNOSIS — M1711 Unilateral primary osteoarthritis, right knee: Secondary | ICD-10-CM | POA: Diagnosis not present

## 2019-10-09 DIAGNOSIS — R202 Paresthesia of skin: Secondary | ICD-10-CM | POA: Diagnosis not present

## 2019-10-09 DIAGNOSIS — F419 Anxiety disorder, unspecified: Secondary | ICD-10-CM | POA: Diagnosis not present

## 2019-10-09 DIAGNOSIS — Z9181 History of falling: Secondary | ICD-10-CM | POA: Diagnosis not present

## 2019-10-09 DIAGNOSIS — J449 Chronic obstructive pulmonary disease, unspecified: Secondary | ICD-10-CM | POA: Diagnosis not present

## 2019-10-09 DIAGNOSIS — E538 Deficiency of other specified B group vitamins: Secondary | ICD-10-CM | POA: Diagnosis not present

## 2019-10-09 DIAGNOSIS — Z8781 Personal history of (healed) traumatic fracture: Secondary | ICD-10-CM | POA: Diagnosis not present

## 2019-10-09 DIAGNOSIS — R609 Edema, unspecified: Secondary | ICD-10-CM | POA: Diagnosis not present

## 2019-10-09 DIAGNOSIS — M545 Low back pain: Secondary | ICD-10-CM | POA: Diagnosis not present

## 2019-10-09 DIAGNOSIS — I10 Essential (primary) hypertension: Secondary | ICD-10-CM | POA: Diagnosis not present

## 2019-10-09 DIAGNOSIS — G8929 Other chronic pain: Secondary | ICD-10-CM | POA: Diagnosis not present

## 2019-10-09 DIAGNOSIS — F329 Major depressive disorder, single episode, unspecified: Secondary | ICD-10-CM | POA: Diagnosis not present

## 2019-10-26 DIAGNOSIS — Z23 Encounter for immunization: Secondary | ICD-10-CM | POA: Diagnosis not present

## 2019-10-28 ENCOUNTER — Encounter (HOSPITAL_COMMUNITY): Payer: Self-pay | Admitting: Emergency Medicine

## 2019-10-28 ENCOUNTER — Ambulatory Visit (HOSPITAL_COMMUNITY)
Admission: EM | Admit: 2019-10-28 | Discharge: 2019-10-28 | Disposition: A | Discharge status: Home / Self Care | Payer: Medicare Other | Attending: Internal Medicine | Admitting: Internal Medicine

## 2019-10-28 ENCOUNTER — Telehealth: Payer: Self-pay | Admitting: Internal Medicine

## 2019-10-28 ENCOUNTER — Other Ambulatory Visit: Payer: Self-pay

## 2019-10-28 ENCOUNTER — Ambulatory Visit (INDEPENDENT_AMBULATORY_CARE_PROVIDER_SITE_OTHER): Payer: Medicare Other

## 2019-10-28 DIAGNOSIS — E785 Hyperlipidemia, unspecified: Secondary | ICD-10-CM | POA: Insufficient documentation

## 2019-10-28 DIAGNOSIS — Z885 Allergy status to narcotic agent status: Secondary | ICD-10-CM | POA: Diagnosis not present

## 2019-10-28 DIAGNOSIS — Z20822 Contact with and (suspected) exposure to covid-19: Secondary | ICD-10-CM | POA: Diagnosis not present

## 2019-10-28 DIAGNOSIS — Z79899 Other long term (current) drug therapy: Secondary | ICD-10-CM | POA: Insufficient documentation

## 2019-10-28 DIAGNOSIS — Z9049 Acquired absence of other specified parts of digestive tract: Secondary | ICD-10-CM | POA: Insufficient documentation

## 2019-10-28 DIAGNOSIS — J441 Chronic obstructive pulmonary disease with (acute) exacerbation: Secondary | ICD-10-CM | POA: Insufficient documentation

## 2019-10-28 DIAGNOSIS — Z87891 Personal history of nicotine dependence: Secondary | ICD-10-CM | POA: Insufficient documentation

## 2019-10-28 DIAGNOSIS — R062 Wheezing: Secondary | ICD-10-CM

## 2019-10-28 DIAGNOSIS — K219 Gastro-esophageal reflux disease without esophagitis: Secondary | ICD-10-CM | POA: Insufficient documentation

## 2019-10-28 DIAGNOSIS — F419 Anxiety disorder, unspecified: Secondary | ICD-10-CM | POA: Diagnosis not present

## 2019-10-28 DIAGNOSIS — R0602 Shortness of breath: Secondary | ICD-10-CM

## 2019-10-28 DIAGNOSIS — I10 Essential (primary) hypertension: Secondary | ICD-10-CM | POA: Insufficient documentation

## 2019-10-28 DIAGNOSIS — Z881 Allergy status to other antibiotic agents status: Secondary | ICD-10-CM | POA: Insufficient documentation

## 2019-10-28 DIAGNOSIS — Z7951 Long term (current) use of inhaled steroids: Secondary | ICD-10-CM | POA: Insufficient documentation

## 2019-10-28 DIAGNOSIS — R059 Cough, unspecified: Secondary | ICD-10-CM | POA: Diagnosis not present

## 2019-10-28 DIAGNOSIS — F329 Major depressive disorder, single episode, unspecified: Secondary | ICD-10-CM | POA: Insufficient documentation

## 2019-10-28 LAB — SARS CORONAVIRUS 2 (TAT 6-24 HRS): SARS Coronavirus 2: NEGATIVE

## 2019-10-28 MED ORDER — GUAIFENESIN ER 600 MG PO TB12: 600.0000 mg | ORAL_TABLET | Freq: Two times a day (BID) | ORAL | 0 refills | Status: AC

## 2019-10-28 MED ORDER — PREDNISONE 20 MG PO TABS: 20.0000 mg | ORAL_TABLET | Freq: Every day | ORAL | 0 refills | Status: AC

## 2019-10-28 MED ORDER — DOXYCYCLINE HYCLATE 100 MG PO CAPS: 100.0000 mg | ORAL_CAPSULE | Freq: Two times a day (BID) | ORAL | 0 refills | Status: AC

## 2019-10-28 NOTE — Telephone Encounter (Signed)
[facility] Report/Call : ---Caller states mother is coughing up green phlegm per mom last night; chest hurts between breasts when coughs; COPD/ Asthma; history of pneumonia. Started Wednesday. She has been vaccinated for COVID and flu. Caller is not with the patient.  Triage nurse unable to reach patient - Called x4  Daughter called back and stated she was driving over her mother's house to check on her and would take her to UC.

## 2019-10-28 NOTE — ED Triage Notes (Signed)
Pt presents with productive cough, SOB, minimal chest pain xs 3 days.

## 2019-10-28 NOTE — ED Provider Notes (Signed)
[facility]    CSN: [id number] Arrival date & time: 10/28/19  1310      History   Chief Complaint Chief Complaint  Patient presents with  . Cough    HPI Desiree [name] is a 84 y.o. female with a history of COPD comes to urgent care with a 3-day history of cough productive of greenish sputum, shortness of breath, wheezing and cough associated chest pain.  Patient denies any fever or chills.  Chest pain is associated with cough.  She denies any chest pain at rest.  No dizziness, near syncope or syncopal episodes.  No nausea, vomiting or diarrhea.  No sick contacts.  Patient is fully immunized against COVID-19 virus.  No COVID-19 exposures.   HPI  Past Medical History:  Diagnosis Date  . Anxiety 02/13/2017  . Asthma   . B12 deficiency anemia 02/13/2017  . Chronic back pain 02/13/2017  . Chronic cough 02/13/2017  . COPD (chronic obstructive pulmonary disease) (HCC)   . Depression 02/13/2017  . Diverticulosis 02/13/2017  . Former smoker 2001  . GERD (gastroesophageal reflux disease) 02/13/2017  . HLD (hyperlipidemia) 02/13/2017  . HTN (hypertension) 02/13/2017  . Lumbar compression fracture (HCC)   . Osteopenia 02/13/2017  . 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   Marland Kitchen Spinal stenosis 02/13/2017    Patient Active Problem List   Diagnosis Date Noted  . 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 Diarrhea 03/18/2019  . Lower abdominal pain 03/18/2019  . AKI (acute kidney injury) (HCC) 03/18/2019  . Degenerative arthritis of right knee 12/23/2018  . Baker's cyst of knee, right 12/23/2018  . Right knee pain 12/22/2018  . Insomnia 07/04/2018  . B12 deficiency 07/04/2018  . Iron deficiency 07/03/2018  . Wheezing 02/26/2018  . Allergic rhinitis 12/25/2017  . COPD exacerbation (HCC) 02/13/2017  . Encounter for well adult exam with abnormal findings 02/13/2017  . Hyperglycemia 02/13/2017    . HLD (hyperlipidemia) 02/13/2017  . B12 deficiency anemia 02/13/2017  . Chronic back pain 02/13/2017  . Depression 02/13/2017  . Diverticulosis 02/13/2017  . Anxiety 02/13/2017  . Chronic cough 02/13/2017  . Spinal stenosis 02/13/2017  . Osteopenia 02/13/2017  . GERD (gastroesophageal reflux disease) 02/13/2017  . HTN (hypertension) 02/13/2017  . COPD (chronic obstructive pulmonary disease) (HCC)   . Asthma   . Lumbar compression fracture [facility])     Past Surgical History:  Procedure Laterality Date  . APPENDECTOMY    . CATARACT EXTRACTION, BILATERAL    . KYPHOPLASTY  2010  . left hip surgury    . right hip surgury      OB History   No obstetric history on file.      Home Medications    Prior to Admission medications   Medication Sig Start Date End Date Taking? Authorizing Provider  albuterol (PROVENTIL) (2.5 MG/3ML) 0.083% nebulizer solution Take 3 mLs (2.5 mg total) by nebulization every 6 (six) hours as needed for wheezing or shortness of breath. 03/18/19   Corwin Levins, MD  citalopram (CELEXA) 20 MG tablet Take 1 tablet (20 mg total) by mouth daily. 04/08/19   Corwin Levins, MD  diphenoxylate-atropine (LOMOTIL) 2.5-0.025 MG tablet Take 1 tablet by mouth 4 (four) times daily as needed for diarrhea or loose stools. 03/23/19   Corwin Levins, MD  doxycycline (VIBRAMYCIN) 100 MG capsule Take 1 capsule (100 mg total) by mouth  2 (two) times daily for 7 days. 10/28/19 11/04/19  Merrilee Jansky, MD  Fluticasone-Salmeterol (ADVAIR DISKUS) 250-50 MCG/DOSE AEPB Inhale 1 puff into the lungs 2 (two) times daily. 05/05/19 05/04/20  Corwin Levins, MD  gabapentin (NEURONTIN) 100 MG capsule Take 1-3 capsules (100-300 mg total) by mouth at bedtime. For nerve pain 07/29/19   Rodolph Bong, MD  guaiFENesin (MUCINEX) 600 MG 12 hr tablet Take 1 tablet (600 mg total) by mouth 2 (two) times daily for 14 days. 10/28/19 11/11/19  Merrilee Jansky, MD  hydrochlorothiazide (HYDRODIURIL) 25 MG tablet TAKE  1 TABLET DAILY 05/26/19   Corwin Levins, MD  iron polysaccharides (NU-IRON) 150 MG capsule Take 1 capsule (150 mg total) by mouth daily. 07/05/18   Corwin Levins, MD  losartan (COZAAR) 100 MG tablet Take 1 tablet (100 mg total) by mouth daily. 05/25/19   Corwin Levins, MD  metroNIDAZOLE (FLAGYL) 500 MG tablet Take 1 tablet (500 mg total) by mouth 3 (three) times daily. 03/23/19   Corwin Levins, MD  montelukast (SINGULAIR) 10 MG tablet Take 1 tablet (10 mg total) by mouth at bedtime. 05/20/19   Corwin Levins, MD  pantoprazole (PROTONIX) 40 MG tablet Take 1 tablet (40 mg total) by mouth daily. 03/18/19   Corwin Levins, MD  potassium chloride (KLOR-CON) 10 MEQ tablet Take 1 tablet (10 mEq total) by mouth daily. 12/23/18   Corwin Levins, MD  predniSONE (DELTASONE) 20 MG tablet Take 1 tablet (20 mg total) by mouth daily for 5 days. 10/28/19 11/02/19  Merrilee Jansky, MD    Family History Family History  Problem Relation Age of Onset  . Depression Father   . Lung cancer Sister   . Lung cancer Brother   . Mesothelioma Son     Social History Social History   Tobacco Use  . Smoking status: Former Games developer  . Smokeless tobacco: Never Used  Vaping Use  . Vaping Use: Never used  Substance Use Topics  . Alcohol use: Yes  . Drug use: No     Allergies   Levaquin [levofloxacin], Percocet [oxycodone-acetaminophen], and Azithromycin   Review of Systems Review of Systems  Constitutional: Negative.   HENT: Negative for congestion, ear discharge, ear pain, postnasal drip and sore throat.   Respiratory: Positive for cough, shortness of breath and wheezing.   Cardiovascular: Negative for chest pain.  Gastrointestinal: Negative.   Neurological: Negative.      Physical Exam Triage Vital Signs ED Triage Vitals  Enc Vitals Group     BP 10/28/19 1346 (!) 120/96     Pulse Rate 10/28/19 1346 74     Resp 10/28/19 1346 16     Temp 10/28/19 1346 97.7 F (36.5 C)     Temp Source 10/28/19 1346 Oral      SpO2 10/28/19 1346 97 %     Weight --      Height --      Head Circumference --      Peak Flow --      Pain Score 10/28/19 1344 5     Pain Loc --      Pain Edu? --      Excl. in GC? --    No data found.  Updated Vital Signs BP (!) 120/96 (BP Location: Right Arm)   Pulse 74   Temp 97.7 F (36.5 C) (Oral)   Resp 16   SpO2 97%   Visual Acuity Right Eye Distance:  Left Eye Distance:   Bilateral Distance:    Right Eye Near:   Left Eye Near:    Bilateral Near:     Physical Exam Vitals and nursing note reviewed.  Constitutional:      General: She is not in acute distress.    Appearance: Normal appearance. She is not ill-appearing.  HENT:     Right Ear: Tympanic membrane normal.     Left Ear: Tympanic membrane normal.  Cardiovascular:     Rate and Rhythm: Normal rate and regular rhythm.     Pulses: Normal pulses.     Heart sounds: Normal heart sounds. No murmur heard.  No friction rub. No gallop.   Pulmonary:     Effort: No respiratory distress.     Breath sounds: No wheezing or rhonchi.  Abdominal:     General: Bowel sounds are normal.     Palpations: Abdomen is soft.  Skin:    General: Skin is warm.  Neurological:     General: No focal deficit present.     Mental Status: She is alert.      UC Treatments / Results  Labs (all labs ordered are listed, but only abnormal results are displayed) Labs Reviewed  SARS CORONAVIRUS 2 (TAT 6-24 HRS)    EKG   Radiology DG Chest 2 View  Result Date: 10/28/2019 CLINICAL DATA:  Shortness of breath with wheezing and cough EXAM: CHEST - 2 VIEW COMPARISON:  February 26, 2018 FINDINGS: Lungs are mildly hyperexpanded. There is no edema or airspace opacity. The heart size and pulmonary vascularity are within normal limits. There is aortic atherosclerosis. Patient is undergone kyphoplasty procedures in the lower thoracic and upper lumbar regions. IMPRESSION: Lungs hyperexpanded without edema or consolidation. Heart size  normal. No adenopathy. Aortic Atherosclerosis (ICD10-I70.0). Electronically Signed   By: Bretta Bang III M.D.   On: 10/28/2019 14:25    Procedures Procedures (including critical care time)  Medications Ordered in UC Medications - No data to display  Initial Impression / Assessment and Plan / UC Course  I have reviewed the triage vital signs and the nursing notes.  Pertinent labs & imaging results that were available during my care of the patient were reviewed by me and considered in my medical decision making (see chart for details).     1.  COPD with acute exacerbation: Doxycycline 100 mg twice daily for 5 days Prednisone 20 mg orally daily for 5 days Albuterol inhaler as needed Mucinex 600 mg every 12 as needed for thick sputum. Chest x-ray is negative for acute lung infiltrate Return to urgent care if symptoms worsen. The patient verbalized understanding of the plan. Final Clinical Impressions(s) / UC Diagnoses   Final diagnoses:  COPD exacerbation [facility])   Discharge Instructions   None    ED Prescriptions    Medication Sig Dispense Auth. Provider   doxycycline (VIBRAMYCIN) 100 MG capsule Take 1 capsule (100 mg total) by mouth 2 (two) times daily for 7 days. 14 capsule  Grosser, Britta Mccreedy, MD   predniSONE (DELTASONE) 20 MG tablet Take 1 tablet (20 mg total) by mouth daily for 5 days. 5 tablet Ronique Simerly, Britta Mccreedy, MD   guaiFENesin (MUCINEX) 600 MG 12 hr tablet Take 1 tablet (600 mg total) by mouth 2 (two) times daily for 14 days. 28 tablet Nafisah Runions, Britta Mccreedy, MD     PDMP not reviewed this encounter.   Merrilee Jansky, MD 10/28/19 [phone]

## 2019-10-31 NOTE — Telephone Encounter (Signed)
Patty with [facility] called and said that the patient is having an allergic reaction to the doxycycline (VIBRAMYCIN) 100 MG capsule  She was itching and had hives all her. The patients daughter was requesting that something else be called in.

## 2019-10-31 NOTE — Telephone Encounter (Signed)
Sent to Dr. John. 

## 2019-10-31 NOTE — Telephone Encounter (Signed)
This is likely to be addressed at [facility], so I would wait so as not to complicate or contradict the tx per UC; ok to let daughter know

## 2019-11-01 ENCOUNTER — Telehealth: Payer: Self-pay | Admitting: Internal Medicine

## 2019-11-01 NOTE — Telephone Encounter (Signed)
Tried calling pts daughter to inform her of Dr. Raphael Gibney suggestion. No answer at this time.

## 2019-11-01 NOTE — Telephone Encounter (Signed)
Curious if Dr. Jonny Ruiz would be willing to send in a prescription for this patient since patient is still sick and they did not continue medication because of the reaction. Problems with breathing UC said is COPD. Also has a cough and pain in chest when she coughs.   doxycycline (VIBRAMYCIN) 100 MG capsule predniSONE (DELTASONE) 20 MG tablet Mucinex OTC unsure of doasge  Patient was prescribed these at [facility] and had a very bad allergic reaction where she broke out in hives and itched until she started bleeding   [facility] on [address]  Last seen: 03/23/2019 Next apt: N/A

## 2019-11-02 NOTE — Telephone Encounter (Signed)
Virtual visit with Dr. Jonny Ruiz has been set up for Friday at 3:40 pm.

## 2019-11-02 NOTE — Telephone Encounter (Signed)
Very sorry, I dont feel comfortable with medical decision making by email   The last I have seen pt is mar 2021, so I dont feel comfortable with f/u care of a number of things happening to her recenlty by email

## 2019-11-02 NOTE — Telephone Encounter (Signed)
Sent to Dr. John. 

## 2019-11-04 ENCOUNTER — Telehealth: Payer: Medicare Other | Admitting: Internal Medicine

## 2019-11-27 DIAGNOSIS — Z23 Encounter for immunization: Secondary | ICD-10-CM | POA: Diagnosis not present

## 2019-12-19 ENCOUNTER — Ambulatory Visit (HOSPITAL_COMMUNITY)
Admission: EM | Admit: 2019-12-19 | Discharge: 2019-12-19 | Disposition: A | Discharge status: Home / Self Care | Payer: Medicare Other | Attending: Internal Medicine | Admitting: Internal Medicine

## 2019-12-19 ENCOUNTER — Telehealth (INDEPENDENT_AMBULATORY_CARE_PROVIDER_SITE_OTHER): Payer: Medicare Other | Admitting: Family

## 2019-12-19 ENCOUNTER — Other Ambulatory Visit: Payer: Self-pay

## 2019-12-19 ENCOUNTER — Encounter (HOSPITAL_COMMUNITY): Payer: Self-pay

## 2019-12-19 ENCOUNTER — Ambulatory Visit (INDEPENDENT_AMBULATORY_CARE_PROVIDER_SITE_OTHER): Payer: Medicare Other

## 2019-12-19 DIAGNOSIS — J441 Chronic obstructive pulmonary disease with (acute) exacerbation: Secondary | ICD-10-CM | POA: Diagnosis not present

## 2019-12-19 DIAGNOSIS — R0602 Shortness of breath: Secondary | ICD-10-CM | POA: Diagnosis not present

## 2019-12-19 DIAGNOSIS — R059 Cough, unspecified: Secondary | ICD-10-CM

## 2019-12-19 LAB — CBC WITH DIFFERENTIAL/PLATELET
Abs Immature Granulocytes: 0.06 10*3/uL (ref 0.00–0.07)
Basophils Absolute: 0 10*3/uL (ref 0.0–0.1)
Basophils Relative: 0 %
Eosinophils Absolute: 0.2 10*3/uL (ref 0.0–0.5)
Eosinophils Relative: 2 %
HCT: 39.6 % (ref 36.0–46.0)
Hemoglobin: 13.2 g/dL (ref 12.0–15.0)
Immature Granulocytes: 1 %
Lymphocytes Relative: 19 %
Lymphs Abs: 2 10*3/uL (ref 0.7–4.0)
MCH: 30.1 pg (ref 26.0–34.0)
MCHC: 33.3 g/dL (ref 30.0–36.0)
MCV: 90.2 fL (ref 80.0–100.0)
Monocytes Absolute: 1.1 10*3/uL — ABNORMAL HIGH (ref 0.1–1.0)
Monocytes Relative: 10 %
Neutro Abs: 7.3 10*3/uL (ref 1.7–7.7)
Neutrophils Relative %: 68 %
Platelets: 388 10*3/uL (ref 150–400)
RBC: 4.39 MIL/uL (ref 3.87–5.11)
RDW: 12.6 % (ref 11.5–15.5)
WBC: 10.7 10*3/uL — ABNORMAL HIGH (ref 4.0–10.5)
nRBC: 0 % (ref 0.0–0.2)

## 2019-12-19 MED ORDER — AMOXICILLIN-POT CLAVULANATE 875-125 MG PO TABS: 1.0000 | ORAL_TABLET | Freq: Two times a day (BID) | ORAL | 0 refills | Status: DC

## 2019-12-19 MED ORDER — BENZONATATE 100 MG PO CAPS: 100.0000 mg | ORAL_CAPSULE | Freq: Three times a day (TID) | ORAL | 0 refills | Status: DC

## 2019-12-19 MED ORDER — PREDNISONE 20 MG PO TABS: 40.0000 mg | ORAL_TABLET | Freq: Every day | ORAL | 0 refills | Status: DC

## 2019-12-19 MED ORDER — METHYLPREDNISOLONE SODIUM SUCC 125 MG IJ SOLR
60.0000 mg | Freq: Once | INTRAMUSCULAR | Status: AC
  Administered 2019-12-19: 60 mg via INTRAMUSCULAR

## 2019-12-19 MED ORDER — METHYLPREDNISOLONE SODIUM SUCC 125 MG IJ SOLR
INTRAMUSCULAR | Status: AC
  Filled 2019-12-19: qty 2

## 2019-12-19 NOTE — ED Provider Notes (Signed)
[facility]    CSN: [id number] Arrival date & time: 12/19/19  1414      History   Chief Complaint Chief Complaint  Patient presents with  . Cough    HPI Desiree [name] is a 84 y.o. female who is fully vaccinated against COVID-19 virus comes to urgent care with complaints of a 2-week history of cough productive of purulent sputum, worsening shortness of breath and chest tightness.  Patient says symptoms started insidiously and has been progressive.  No fever or chills.  Patient denies any wheezing.  Sputum is yellowish in color.  No headaches or generalized body aches.  Patient is fully vaccinated against COVID-19 virus and has received her booster shot.  She denies any loss of taste or smell.   Patient was evaluated via virtual visit today and advised to come to the urgent care to be evaluated further  HPI  Past Medical History:  Diagnosis Date  . Anxiety 02/13/2017  . Asthma   . B12 deficiency anemia 02/13/2017  . Chronic back pain 02/13/2017  . Chronic cough 02/13/2017  . COPD (chronic obstructive pulmonary disease) (HCC)   . Depression 02/13/2017  . Diverticulosis 02/13/2017  . Former smoker 2001  . GERD (gastroesophageal reflux disease) 02/13/2017  . HLD (hyperlipidemia) 02/13/2017  . HTN (hypertension) 02/13/2017  . Lumbar compression fracture (HCC)   . Osteopenia 02/13/2017  . 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   Marland Kitchen Spinal stenosis 02/13/2017    Patient Active Problem List   Diagnosis Date Noted  . 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 Diarrhea 03/18/2019  . Lower abdominal pain 03/18/2019  . AKI (acute kidney injury) (HCC) 03/18/2019  . Degenerative arthritis of right knee 12/23/2018  . Baker's cyst of knee, right 12/23/2018  . Right knee pain 12/22/2018  . Insomnia 07/04/2018  . B12 deficiency 07/04/2018  . Iron deficiency 07/03/2018  . Wheezing 02/26/2018    . Allergic rhinitis 12/25/2017  . COPD exacerbation (HCC) 02/13/2017  . Encounter for well adult exam with abnormal findings 02/13/2017  . Hyperglycemia 02/13/2017  . HLD (hyperlipidemia) 02/13/2017  . B12 deficiency anemia 02/13/2017  . Chronic back pain 02/13/2017  . Depression 02/13/2017  . Diverticulosis 02/13/2017  . Anxiety 02/13/2017  . Chronic cough 02/13/2017  . Spinal stenosis 02/13/2017  . Osteopenia 02/13/2017  . GERD (gastroesophageal reflux disease) 02/13/2017  . HTN (hypertension) 02/13/2017  . COPD (chronic obstructive pulmonary disease) (HCC)   . Asthma   . Lumbar compression fracture [facility])     Past Surgical History:  Procedure Laterality Date  . APPENDECTOMY    . CATARACT EXTRACTION, BILATERAL    . KYPHOPLASTY  2010  . left hip surgury    . right hip surgury      OB History   No obstetric history on file.      Home Medications    Prior to Admission medications   Medication Sig Start Date End Date Taking? Authorizing Provider  albuterol (PROVENTIL) (2.5 MG/3ML) 0.083% nebulizer solution Take 3 mLs (2.5 mg total) by nebulization every 6 (six) hours as needed for wheezing or shortness of breath. 03/18/19   Corwin Levins, MD  amoxicillin-clavulanate (AUGMENTIN) 875-125 MG tablet Take 1 tablet by mouth every 12 (twelve) hours. 12/19/19   Gershom Brobeck, Britta Mccreedy, MD  benzonatate (TESSALON) 100 MG capsule Take 1 capsule (100 mg total) by mouth every 8 (eight)  hours. 12/19/19   Tamon Parkerson, Britta MccreedyPhilip O, MD  citalopram (CELEXA) 20 MG tablet Take 1 tablet (20 mg total) by mouth daily. 04/08/19   Corwin LevinsJohn, James W, MD  diphenoxylate-atropine (LOMOTIL) 2.5-0.025 MG tablet Take 1 tablet by mouth 4 (four) times daily as needed for diarrhea or loose stools. 03/23/19   Corwin LevinsJohn, James W, MD  Fluticasone-Salmeterol (ADVAIR DISKUS) 250-50 MCG/DOSE AEPB Inhale 1 puff into the lungs 2 (two) times daily. 05/05/19 05/04/20  Corwin LevinsJohn, James W, MD  gabapentin (NEURONTIN) 100 MG capsule Take 1-3 capsules  (100-300 mg total) by mouth at bedtime. For nerve pain 07/29/19   Rodolph Bongorey, Evan S, MD  hydrochlorothiazide (HYDRODIURIL) 25 MG tablet TAKE 1 TABLET DAILY 05/26/19   Corwin LevinsJohn, James W, MD  iron polysaccharides (NU-IRON) 150 MG capsule Take 1 capsule (150 mg total) by mouth daily. 07/05/18   Corwin LevinsJohn, James W, MD  losartan (COZAAR) 100 MG tablet Take 1 tablet (100 mg total) by mouth daily. 05/25/19   Corwin LevinsJohn, James W, MD  montelukast (SINGULAIR) 10 MG tablet Take 1 tablet (10 mg total) by mouth at bedtime. 05/20/19   Corwin LevinsJohn, James W, MD  pantoprazole (PROTONIX) 40 MG tablet Take 1 tablet (40 mg total) by mouth daily. 03/18/19   Corwin LevinsJohn, James W, MD  potassium chloride (KLOR-CON) 10 MEQ tablet Take 1 tablet (10 mEq total) by mouth daily. 12/23/18   Corwin LevinsJohn, James W, MD  predniSONE (DELTASONE) 20 MG tablet Take 2 tablets (40 mg total) by mouth daily for 5 days. 12/19/19 12/24/19  Merrilee JanskyLamptey, Alistar Mcenery O, MD    Family History Family History  Problem Relation Age of Onset  . Depression Father   . Lung cancer Sister   . Lung cancer Brother   . Mesothelioma Son     Social History Social History   Tobacco Use  . Smoking status: Former Games developermoker  . Smokeless tobacco: Never Used  Vaping Use  . Vaping Use: Never used  Substance Use Topics  . Alcohol use: Yes  . Drug use: No     Allergies   Doxycycline, Levaquin [levofloxacin], Percocet [oxycodone-acetaminophen], and Azithromycin   Review of Systems Review of Systems  Constitutional: Negative for chills and fatigue.  HENT: Positive for voice change. Negative for sinus pressure and sneezing.   Respiratory: Positive for cough, chest tightness and shortness of breath. Negative for wheezing.   Cardiovascular: Negative for chest pain and palpitations.  Gastrointestinal: Negative.   Genitourinary: Negative.   Neurological: Negative.      Physical Exam Triage Vital Signs ED Triage Vitals  Enc Vitals Group     BP 12/19/19 1536 (!) 118/48     Pulse Rate 12/19/19 1536 82      Resp 12/19/19 1536 16     Temp 12/19/19 1536 98.3 F (36.8 C)     Temp Source 12/19/19 1536 Oral     SpO2 12/19/19 1536 99 %     Weight --      Height --      Head Circumference --      Peak Flow --      Pain Score 12/19/19 1533 4     Pain Loc --      Pain Edu? --      Excl. in GC? --    No data found.  Updated Vital Signs BP (!) 118/58 (BP Location: Right Arm)   Pulse 82   Temp 98.3 F (36.8 C) (Oral)   Resp 16   SpO2 99%   Visual Acuity Right  Eye Distance:   Left Eye Distance:   Bilateral Distance:    Right Eye Near:   Left Eye Near:    Bilateral Near:     Physical Exam Vitals and nursing note reviewed.  Constitutional:      General: She is not in acute distress.    Appearance: She is ill-appearing.  Cardiovascular:     Rate and Rhythm: Normal rate and regular rhythm.     Pulses: Normal pulses.     Heart sounds: Normal heart sounds.  Pulmonary:     Effort: Pulmonary effort is normal.     Breath sounds: Normal breath sounds. No wheezing or rhonchi.  Abdominal:     General: Bowel sounds are normal.     Palpations: Abdomen is soft.  Musculoskeletal:        General: Normal range of motion.  Neurological:     Mental Status: She is alert.      UC Treatments / Results  Labs (all labs ordered are listed, but only abnormal results are displayed) Labs Reviewed  CBC WITH DIFFERENTIAL/PLATELET    EKG   Radiology DG Chest 2 View  Result Date: 12/19/2019 CLINICAL DATA:  Two weeks of cough, nonproductive with shortness of breath EXAM: CHEST - 2 VIEW COMPARISON:  Radiograph 10/28/2019 FINDINGS: Chronic hyperinflation with flattening of the diaphragms. Coarsened interstitial and bronchitic features are similar to comparison exam. No new consolidative opacity is evident. The aorta is calcified. The remaining cardiomediastinal contours are unremarkable. Remote compression deformities at the thoracolumbar junction with post kyphoplasty changes similar in appearance  to comparison exams. Background of diffuse multilevel discogenic and facet degenerative changes. Additional degenerative changes in the shoulders. No acute osseous or soft tissue abnormality. IMPRESSION: 1. Chronic hyperinflation and chronic interstitial and bronchitic features. 2. No acute cardiopulmonary abnormality. 3. Unchanged thoracolumbar compression deformities and kyphoplasty changes. Electronically Signed   By: Kreg Shropshire M.D.   On: 12/19/2019 16:21    Procedures Procedures (including critical care time)  Medications Ordered in UC Medications  methylPREDNISolone sodium succinate (SOLU-MEDROL) 125 mg/2 mL injection 60 mg (60 mg Intramuscular Given 12/19/19 1607)    Initial Impression / Assessment and Plan / UC Course  I have reviewed the triage vital signs and the nursing notes.  Pertinent labs & imaging results that were available during my care of the patient were reviewed by me and considered in my medical decision making (see chart for details).     1.  Acute exacerbation of chronic obstructive pulmonary disease: Chest x-ray is negative for acute lung infiltrate Solu-Medrol 60 mg IM x1 dose Prednisone 40 mg orally daily for 5 days Augmentin 875-125 mg 1 tablet twice daily for 7 days Patient is advised to continue using her bronchodilators If symptoms persist patient is advised to go to the emergency department to be managed further. Final Clinical Impressions(s) / UC Diagnoses   Final diagnoses:  COPD exacerbation (HCC)  Acute exacerbation of chronic obstructive pulmonary disease (COPD) (HCC)     Discharge Instructions     Take medications as tolerated. Chest x-ray does not demonstrate pneumonia If you have worsening shortness of breath, fever, chills or altered mentation please go to emergency department for further evaluation.   ED Prescriptions    Medication Sig Dispense Auth. Provider   predniSONE (DELTASONE) 20 MG tablet Take 2 tablets (40 mg total) by  mouth daily for 5 days. 10 tablet Andromeda Poppen, Britta Mccreedy, MD   amoxicillin-clavulanate (AUGMENTIN) 875-125 MG tablet Take 1 tablet by  mouth every 12 (twelve) hours. 14 tablet Tasheema Perrone, Britta Mccreedy, MD   benzonatate (TESSALON) 100 MG capsule Take 1 capsule (100 mg total) by mouth every 8 (eight) hours. 21 capsule Raeleigh Guinn, Britta Mccreedy, MD     PDMP not reviewed this encounter.   Merrilee Jansky, MD 12/19/19 [phone]

## 2019-12-19 NOTE — Discharge Instructions (Addendum)
Take medications as tolerated. Chest x-ray does not demonstrate pneumonia If you have worsening shortness of breath, fever, chills or altered mentation please go to emergency department for further evaluation.

## 2019-12-19 NOTE — Progress Notes (Signed)
Desiree [name] is a 84 y.o. female with the following history as recorded in EpicCare:  Patient Active Problem List   Diagnosis Date Noted  . 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 Diarrhea 03/18/2019  . Lower abdominal pain 03/18/2019  . AKI (acute kidney injury) (HCC) 03/18/2019  . Degenerative arthritis of right knee 12/23/2018  . Baker's cyst of knee, right 12/23/2018  . Right knee pain 12/22/2018  . Insomnia 07/04/2018  . B12 deficiency 07/04/2018  . Iron deficiency 07/03/2018  . Wheezing 02/26/2018  . Allergic rhinitis 12/25/2017  . COPD exacerbation (HCC) 02/13/2017  . Encounter for well adult exam with abnormal findings 02/13/2017  . Hyperglycemia 02/13/2017  . HLD (hyperlipidemia) 02/13/2017  . B12 deficiency anemia 02/13/2017  . Chronic back pain 02/13/2017  . Depression 02/13/2017  . Diverticulosis 02/13/2017  . Anxiety 02/13/2017  . Chronic cough 02/13/2017  . Spinal stenosis 02/13/2017  . Osteopenia 02/13/2017  . GERD (gastroesophageal reflux disease) 02/13/2017  . HTN (hypertension) 02/13/2017  . COPD (chronic obstructive pulmonary disease) (HCC)   . Asthma   . Lumbar compression fracture [facility])     Current Outpatient Medications  Medication Sig Dispense Refill  . albuterol (PROVENTIL) (2.5 MG/3ML) 0.083% nebulizer solution Take 3 mLs (2.5 mg total) by nebulization every 6 (six) hours as needed for wheezing or shortness of breath. 75 mL 12  . citalopram (CELEXA) 20 MG tablet Take 1 tablet (20 mg total) by mouth daily. 90 tablet 3  . diphenoxylate-atropine (LOMOTIL) 2.5-0.025 MG tablet Take 1 tablet by mouth 4 (four) times daily as needed for diarrhea or loose stools. 40 tablet 0  . Fluticasone-Salmeterol (ADVAIR DISKUS) 250-50 MCG/DOSE AEPB Inhale 1 puff into the lungs 2 (two) times daily. 3 each 3  . gabapentin (NEURONTIN) 100 MG capsule Take 1-3 capsules (100-300 mg total) by mouth at bedtime. For nerve pain 30 capsule 3  . hydrochlorothiazide (HYDRODIURIL)  25 MG tablet TAKE 1 TABLET DAILY 90 tablet 3  . iron polysaccharides (NU-IRON) 150 MG capsule Take 1 capsule (150 mg total) by mouth daily. 90 capsule 1  . losartan (COZAAR) 100 MG tablet Take 1 tablet (100 mg total) by mouth daily. 90 tablet 3  . montelukast (SINGULAIR) 10 MG tablet Take 1 tablet (10 mg total) by mouth at bedtime. 90 tablet 1  . pantoprazole (PROTONIX) 40 MG tablet Take 1 tablet (40 mg total) by mouth daily. 90 tablet 3  . potassium chloride (KLOR-CON) 10 MEQ tablet Take 1 tablet (10 mEq total) by mouth daily. 90 tablet 3   No current facility-administered medications for this visit.    Allergies: Doxycycline, Levaquin [levofloxacin], Percocet [oxycodone-acetaminophen], and Azithromycin  Past Medical History:  Diagnosis Date  . Anxiety 02/13/2017  . Asthma   . B12 deficiency anemia 02/13/2017  . Chronic back pain 02/13/2017  . Chronic cough 02/13/2017  . COPD (chronic obstructive pulmonary disease) (HCC)   . Depression 02/13/2017  . Diverticulosis 02/13/2017  . Former smoker 2001  . GERD (gastroesophageal reflux disease) 02/13/2017  . HLD (hyperlipidemia) 02/13/2017  . HTN (hypertension) 02/13/2017  . Lumbar compression fracture (HCC)   . Osteopenia 02/13/2017  . 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   Marland Kitchen Spinal stenosis 02/13/2017    Past Surgical History:  Procedure Laterality Date  . APPENDECTOMY    . CATARACT EXTRACTION, BILATERAL    . KYPHOPLASTY  2010  . left  hip surgury    . right hip surgury      Family History  Problem Relation Age of Onset  . Depression Father   . Lung cancer Sister   . Lung cancer Brother   . Mesothelioma Son     Social History   Tobacco Use  . Smoking status: Former Games developer  . Smokeless tobacco: Never Used  Substance Use Topics  . Alcohol use: Yes    Subjective:   I connected with Desiree [name] on 12/19/19 at  1:00 PM EST by a video  enabled telemedicine application and verified that I am speaking with the correct person using two identifiers.   I discussed the limitations of evaluation and management by telemedicine and the availability of in person appointments. The patient expressed understanding and agreed to proceed. Provider in office/ patient is at home; provider and patient and patient's daughter are only 3 people on video call.   Cough x 1 week; daughter is worried about how bad her mother's cough sounds/ notes that patient is prone to pneumonia; per patient, she is feeling weak and has "no energy." Does have history of COPD- did 3 nebulizer treatments yesterday but none today; using Adviar 2 x per day; EMS was called to patient's apartment yesterday due to symptoms and epsiodes of dizziness; per daughter, EMS states that vital signs were good and lungs were clear; both patient and daughter agree that she has worsened since EMS was at her apartment yesterday;  Patient notes that her chest feels heavy/ "hurts to breathe."   Objective:  There were no vitals filed for this visit.  General: Well developed, well nourished, in no acute distress  Head: Normocephalic and atraumatic  Lungs: Respirations unlabored;  Neurologic: Alert and oriented; speech intact; face symmetrical;  Assessment:  1. COPD exacerbation (HCC)     Plan:  Based on symptoms discussed and how the patient's cough sounds on the video visit, feel she needs to be seen in person and may need to be admitted; am concerned for pneumonia; daughter agrees to take her mother to [facility] and then necessary follow-up can be determined.    No follow-ups on file.  No orders of the defined types were placed in this encounter.   Requested Prescriptions    No prescriptions requested or ordered in this encounter

## 2019-12-19 NOTE — ED Triage Notes (Signed)
Pt presents with non productive cough and shortness of breath X 1 week; pt has Hx of pneumonia.

## 2019-12-22 ENCOUNTER — Other Ambulatory Visit: Payer: Self-pay

## 2019-12-22 ENCOUNTER — Emergency Department (HOSPITAL_COMMUNITY): Payer: Medicare Other

## 2019-12-22 ENCOUNTER — Emergency Department (HOSPITAL_COMMUNITY)
Admission: EM | Admit: 2019-12-22 | Discharge: 2019-12-22 | Disposition: A | Discharge status: Home / Self Care | Payer: Medicare Other | Attending: Emergency Medicine | Admitting: Emergency Medicine

## 2019-12-22 DIAGNOSIS — Z20822 Contact with and (suspected) exposure to covid-19: Secondary | ICD-10-CM | POA: Insufficient documentation

## 2019-12-22 DIAGNOSIS — J441 Chronic obstructive pulmonary disease with (acute) exacerbation: Secondary | ICD-10-CM | POA: Insufficient documentation

## 2019-12-22 DIAGNOSIS — Z87891 Personal history of nicotine dependence: Secondary | ICD-10-CM | POA: Diagnosis not present

## 2019-12-22 DIAGNOSIS — R0602 Shortness of breath: Secondary | ICD-10-CM | POA: Diagnosis not present

## 2019-12-22 DIAGNOSIS — J439 Emphysema, unspecified: Secondary | ICD-10-CM | POA: Diagnosis not present

## 2019-12-22 DIAGNOSIS — R4702 Dysphasia: Secondary | ICD-10-CM | POA: Diagnosis not present

## 2019-12-22 DIAGNOSIS — R112 Nausea with vomiting, unspecified: Secondary | ICD-10-CM | POA: Diagnosis not present

## 2019-12-22 DIAGNOSIS — R110 Nausea: Secondary | ICD-10-CM | POA: Diagnosis not present

## 2019-12-22 DIAGNOSIS — J80 Acute respiratory distress syndrome: Secondary | ICD-10-CM | POA: Diagnosis not present

## 2019-12-22 DIAGNOSIS — I10 Essential (primary) hypertension: Secondary | ICD-10-CM | POA: Insufficient documentation

## 2019-12-22 DIAGNOSIS — R197 Diarrhea, unspecified: Secondary | ICD-10-CM | POA: Diagnosis not present

## 2019-12-22 DIAGNOSIS — B349 Viral infection, unspecified: Secondary | ICD-10-CM | POA: Diagnosis not present

## 2019-12-22 DIAGNOSIS — R1110 Vomiting, unspecified: Secondary | ICD-10-CM | POA: Diagnosis not present

## 2019-12-22 DIAGNOSIS — R062 Wheezing: Secondary | ICD-10-CM | POA: Diagnosis not present

## 2019-12-22 LAB — CBC
HCT: 46.2 % — ABNORMAL HIGH (ref 36.0–46.0)
Hemoglobin: 15.4 g/dL — ABNORMAL HIGH (ref 12.0–15.0)
MCH: 30.4 pg (ref 26.0–34.0)
MCHC: 33.3 g/dL (ref 30.0–36.0)
MCV: 91.1 fL (ref 80.0–100.0)
Platelets: 474 10*3/uL — ABNORMAL HIGH (ref 150–400)
RBC: 5.07 MIL/uL (ref 3.87–5.11)
RDW: 12.7 % (ref 11.5–15.5)
WBC: 12.2 10*3/uL — ABNORMAL HIGH (ref 4.0–10.5)
nRBC: 0 % (ref 0.0–0.2)

## 2019-12-22 LAB — BASIC METABOLIC PANEL
Anion gap: 14 (ref 5–15)
BUN: 31 mg/dL — ABNORMAL HIGH (ref 8–23)
CO2: 23 mmol/L (ref 22–32)
Calcium: 8.7 mg/dL — ABNORMAL LOW (ref 8.9–10.3)
Chloride: 103 mmol/L (ref 98–111)
Creatinine, Ser: 1.22 mg/dL — ABNORMAL HIGH (ref 0.44–1.00)
GFR, Estimated: 44 mL/min — ABNORMAL LOW (ref >60)
Glucose, Bld: 155 mg/dL — ABNORMAL HIGH (ref 70–99)
Potassium: 3.4 mmol/L — ABNORMAL LOW (ref 3.5–5.1)
Sodium: 140 mmol/L (ref 135–145)

## 2019-12-22 LAB — RESP PANEL BY RT-PCR (FLU A&B, COVID) ARPGX2
Influenza A by PCR: NEGATIVE
Influenza B by PCR: NEGATIVE
SARS Coronavirus 2 by RT PCR: NEGATIVE

## 2019-12-22 LAB — TROPONIN I (HIGH SENSITIVITY): Troponin I (High Sensitivity): 7 ng/L (ref <18)

## 2019-12-22 MED ORDER — PREDNISONE 20 MG PO TABS: 40.0000 mg | ORAL_TABLET | Freq: Every day | ORAL | 0 refills | Status: AC

## 2019-12-22 MED ORDER — PREDNISONE 20 MG PO TABS
60.0000 mg | ORAL_TABLET | Freq: Once | ORAL | Status: AC
  Administered 2019-12-22: 60 mg via ORAL
  Filled 2019-12-22: qty 3

## 2019-12-22 MED ORDER — SODIUM CHLORIDE 0.9 % IV BOLUS
500.0000 mL | Freq: Once | INTRAVENOUS | Status: AC
  Administered 2019-12-22: 500 mL via INTRAVENOUS

## 2019-12-22 MED ORDER — CEFDINIR 300 MG PO CAPS: 300.0000 mg | ORAL_CAPSULE | Freq: Two times a day (BID) | ORAL | 0 refills | Status: AC

## 2019-12-22 MED ORDER — ALBUTEROL SULFATE HFA 108 (90 BASE) MCG/ACT IN AERS
2.0000 | INHALATION_SPRAY | Freq: Once | RESPIRATORY_TRACT | Status: AC
  Administered 2019-12-22: 2 via RESPIRATORY_TRACT
  Filled 2019-12-22: qty 6.7

## 2019-12-22 NOTE — Discharge Instructions (Addendum)
You were evaluated in the Emergency Department and after careful evaluation, we did not find any emergent condition requiring admission or further testing in the hospital.  Your exam/testing today was overall reassuring.  Symptoms seem to be due to a viral illness causing a flare of your COPD.  Please take the prednisone medication as well as the Omnicef antibiotic as directed.  Please return to the Emergency Department if you experience any worsening of your condition.  Thank you for allowing [place] to be a part of your care.

## 2019-12-22 NOTE — ED Triage Notes (Signed)
Pt reports traveling to [place] for family death and returning home Jan 06, 2023 and feeling bilateral ear pain on 11/25 and n/v/d. Pt reports daughter with similar symptoms

## 2019-12-22 NOTE — ED Provider Notes (Signed)
[facility]-EMERGENCY DEPT [facility] Emergency Department Provider Note MRN:  [id number]  Arrival date & time: 12/22/19     Chief Complaint   Shortness of Breath   History of Present Illness   Desiree [name] is a 84 y.o. year-old female with a history of COPD presenting to the ED with chief complaint of SOB.  Persistent nausea and vomiting for the past 3 days.  Feels dehydrated.  Also experiencing cough, shortness of breath.  Denies chest pain.  No abdominal pain.  Mild diarrhea.  No leg pain or swelling.  Was traveling from [place] for a funeral.  Fully vaccinated.  Symptoms constant, mild to moderate, no exacerbating or alleviating factors.  Review of Systems  A complete 10 system review of systems was obtained and all systems are negative except as noted in the HPI and PMH.   Patient's Health History    Past Medical History:  Diagnosis Date  . Anxiety 02/13/2017  . Asthma   . B12 deficiency anemia 02/13/2017  . Chronic back pain 02/13/2017  . Chronic cough 02/13/2017  . COPD (chronic obstructive pulmonary disease) (HCC)   . Depression 02/13/2017  . Diverticulosis 02/13/2017  . Former smoker 2001  . GERD (gastroesophageal reflux disease) 02/13/2017  . HLD (hyperlipidemia) 02/13/2017  . HTN (hypertension) 02/13/2017  . Lumbar compression fracture (HCC)   . Osteopenia 02/13/2017  . 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   Marland Kitchen Spinal stenosis 02/13/2017    Past Surgical History:  Procedure Laterality Date  . APPENDECTOMY    . CATARACT EXTRACTION, BILATERAL    . KYPHOPLASTY  2010  . left hip surgury    . right hip surgury      Family History  Problem Relation Age of Onset  . Depression Father   . Lung cancer Sister   . Lung cancer Brother   . Mesothelioma Son     Social History   Socioeconomic History  . Marital status: Single    Spouse name: Not on file  . Number of children: Not  on file  . Years of education: Not on file  . Highest education level: Not on file  Occupational History  . Not on file  Tobacco Use  . Smoking status: Former Games developer  . Smokeless tobacco: Never Used  Vaping Use  . Vaping Use: Never used  Substance and Sexual Activity  . Alcohol use: Yes  . Drug use: No  . Sexual activity: Not on file  Other Topics Concern  . Not on file  Social History Narrative  . Not on file   Social Determinants of Health   Financial Resource Strain:   . Difficulty of Paying Living Expenses: Not on file  Food Insecurity:   . Worried About Programme researcher, broadcasting/film/video in the Last Year: Not on file  . Ran Out of Food in the Last Year: Not on file  Transportation Needs:   . Lack of Transportation (Medical): Not on file  . Lack of Transportation (Non-Medical): Not on file  Physical Activity:   . Days of Exercise per Week: Not on file  . Minutes of Exercise per Session: Not on file  Stress:   . Feeling of Stress : Not on file  Social Connections:   . Frequency of Communication with Friends and Family: Not on file  . Frequency of Social Gatherings with Friends and Family: Not on file  .  Attends Religious Services: Not on file  . Active Member of Clubs or Organizations: Not on file  . Attends Banker Meetings: Not on file  . Marital Status: Not on file  Intimate Partner Violence:   . Fear of Current or Ex-Partner: Not on file  . Emotionally Abused: Not on file  . Physically Abused: Not on file  . Sexually Abused: Not on file     Physical Exam   Vitals:   12/22/19 1945 12/22/19 2159  BP: 132/69 (!) 148/73  Pulse: 71 69  Resp: 15 15  Temp:  97.9 F (36.6 C)  SpO2: 97% 100%    CONSTITUTIONAL: Well-appearing, NAD NEURO:  Alert and oriented x 3, no focal deficits EYES:  eyes equal and reactive ENT/NECK:  no LAD, no JVD CARDIO: Regular rate, well-perfused, normal S1 and S2 PULM:  CTAB no wheezing or rhonchi GI/GU:  normal bowel sounds,  non-distended, non-tender MSK/SPINE:  No gross deformities, no edema SKIN:  no rash, atraumatic PSYCH:  Appropriate speech and behavior  *Additional and/or pertinent findings included in MDM below  Diagnostic and Interventional Summary    EKG Interpretation  Date/Time:  Thursday December 22 2019 11:50:36 EST Ventricular Rate:  82 PR Interval:    QRS Duration: 74 QT Interval:  383 QTC Calculation: 448 R Axis:   76 Text Interpretation: Sinus rhythm Atrial premature complexes in couplets 12 Lead; Mason-Likar Confirmed by Kennis Carina [phone]) on 12/22/2019 6:29:49 PM      Labs Reviewed  BASIC METABOLIC PANEL - Abnormal; Notable for the following components:      Result Value   Potassium 3.4 (*)    Glucose, Bld 155 (*)    BUN 31 (*)    Creatinine, Ser 1.22 (*)    Calcium 8.7 (*)    GFR, Estimated 44 (*)    All other components within normal limits  CBC - Abnormal; Notable for the following components:   WBC 12.2 (*)    Hemoglobin 15.4 (*)    HCT 46.2 (*)    Platelets 474 (*)    All other components within normal limits  RESP PANEL BY RT-PCR (FLU A&B, COVID) ARPGX2  TROPONIN I (HIGH SENSITIVITY)  TROPONIN I (HIGH SENSITIVITY)    DG Chest Port 1 View  Final Result      Medications  sodium chloride 0.9 % bolus 500 mL (0 mLs Intravenous Stopped 12/22/19 2155)  predniSONE (DELTASONE) tablet 60 mg (60 mg Oral Given 12/22/19 1915)  albuterol (VENTOLIN HFA) 108 (90 Base) MCG/ACT inhaler 2 puff (2 puffs Inhalation Given 12/22/19 1916)     Procedures  /  Critical Care Procedures  ED Course and Medical Decision Making  I have reviewed the triage vital signs, the nursing notes, and pertinent available records from the EMR.  Listed above are laboratory and imaging tests that I personally ordered, reviewed, and interpreted and then considered in my medical decision making (see below for details).  Nausea, vomiting, diarrhea, cough, shortness of breath, favoring viral illness,  possibly COVID-19.  Also considering pneumonia.  Benign abdomen, reassuring vital signs, some wheezing on exam and so COPD exacerbation could be contributing.  Providing fluids, albuterol, steroids, work-up pending.     Work-up is reassuring, patient continues to have normal vital signs, no increased work of breathing, suspect viral illness with COPD exacerbation, appropriate for discharge.  Elmer Sow. Pilar Plate, MD [facility] Emergency Medicine [facility] [email or web address]  Final Clinical Impressions(s) / ED Diagnoses  ICD-10-CM   1. Viral illness  B34.9   2. COPD exacerbation (HCC)  J44.1     ED Discharge Orders         Ordered    predniSONE (DELTASONE) 20 MG tablet  Daily        12/22/19 2148    cefdinir (OMNICEF) 300 MG capsule  2 times daily        12/22/19 2148           Discharge Instructions Discussed with and Provided to Patient:     Discharge Instructions     You were evaluated in the Emergency Department and after careful evaluation, we did not find any emergent condition requiring admission or further testing in the hospital.  Your exam/testing today was overall reassuring.  Symptoms seem to be due to a viral illness causing a flare of your COPD.  Please take the prednisone medication as well as the Omnicef antibiotic as directed.  Please return to the Emergency Department if you experience any worsening of your condition.  Thank you for allowing [place] to be a part of your care.        Sabas Sous, MD 12/22/19 2312

## 2019-12-29 ENCOUNTER — Telehealth (INDEPENDENT_AMBULATORY_CARE_PROVIDER_SITE_OTHER): Payer: Medicare Other | Admitting: Family Medicine

## 2019-12-29 DIAGNOSIS — R112 Nausea with vomiting, unspecified: Secondary | ICD-10-CM | POA: Diagnosis not present

## 2019-12-29 DIAGNOSIS — R197 Diarrhea, unspecified: Secondary | ICD-10-CM | POA: Diagnosis not present

## 2019-12-29 MED ORDER — ONDANSETRON 4 MG PO TBDP: 4.0000 mg | ORAL_TABLET | Freq: Three times a day (TID) | ORAL | 0 refills | Status: AC | PRN

## 2019-12-29 NOTE — Patient Instructions (Addendum)
-  I sent the medication(s) we discussed to your pharmacy: Meds ordered this encounter  Medications  . ondansetron (ZOFRAN ODT) 4 MG disintegrating tablet    Sig: Take 1 tablet (4 mg total) by mouth every 8 (eight) hours as needed for nausea or vomiting.    Dispense:  20 tablet    Refill:  0   Use Imodium if you have any further diarrhea.  No dairy for 1 week and until feeling better.  I hope you are feeling better soon!  Seek in person care promptly if your symptoms worsen, new concerns arise or you are not improving with treatment over the next 24 to 48 hours.  It was nice to meet you today. I help [facility] out with telemedicine visits on Tuesdays and Thursdays and am available for visits on those days. If you have any concerns or questions following this visit please schedule a follow up visit with your Primary Care doctor or seek care at a local urgent care clinic to avoid delays in care.

## 2019-12-29 NOTE — Progress Notes (Signed)
Virtual Visit via Video Note  I connected with Desiree [name]  on 12/29/19 at  4:20 PM EST by a video enabled telemedicine application and verified that I am speaking with the correct person using two identifiers.  Location patient: home, [place] Location provider:work or home office Persons participating in the virtual visit: patient, provider, daughter  I discussed the limitations of evaluation and management by telemedicine and the availability of in person appointments. The patient expressed understanding and agreed to proceed.   HPI:  Acute telemedicine visit for diarrhea: -had resp illness that also had GI symptoms around thanksgiving after funeral she attended - saw PCP and went to [facility], was treated with steroids and abx 11/29 -covid testing was negative -worsened about 1.5-2 weeks ago with nausea, vomiting and diarrhea, seen in ER and diagnosed with viral illness -she has hx of GERD and upset stomach, has needed zofran  -she felt better for a few days, but then had emesis x1 yesterday and had watery diarrhea 3 times today -no fevers now, cough and breathing issues have resolved -Denies:melena, hematochezia, abdominal pain, foul smell to the stool -COVID-19 vaccine status: fully vaccinated  ROS: See pertinent positives and negatives per HPI.  Past Medical History:  Diagnosis Date  . Anxiety 02/13/2017  . Asthma   . B12 deficiency anemia 02/13/2017  . Chronic back pain 02/13/2017  . Chronic cough 02/13/2017  . COPD (chronic obstructive pulmonary disease) (HCC)   . Depression 02/13/2017  . Diverticulosis 02/13/2017  . Former smoker 2001  . GERD (gastroesophageal reflux disease) 02/13/2017  . HLD (hyperlipidemia) 02/13/2017  . HTN (hypertension) 02/13/2017  . Lumbar compression fracture (HCC)   . Osteopenia 02/13/2017  . 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   Marland Kitchen Spinal stenosis 02/13/2017    Past  Surgical History:  Procedure Laterality Date  . APPENDECTOMY    . CATARACT EXTRACTION, BILATERAL    . KYPHOPLASTY  2010  . left hip surgury    . right hip surgury       Current Outpatient Medications:  .  albuterol (PROVENTIL) (2.5 MG/3ML) 0.083% nebulizer solution, Take 3 mLs (2.5 mg total) by nebulization every 6 (six) hours as needed for wheezing or shortness of breath., Disp: 75 mL, Rfl: 12 .  amoxicillin-clavulanate (AUGMENTIN) 875-125 MG tablet, Take 1 tablet by mouth every 12 (twelve) hours. (Patient taking differently: Take 1 tablet by mouth every 12 (twelve) hours. Start date : 12/20/19), Disp: 14 tablet, Rfl: 0 .  benzonatate (TESSALON) 100 MG capsule, Take 1 capsule (100 mg total) by mouth every 8 (eight) hours., Disp: 21 capsule, Rfl: 0 .  cefdinir (OMNICEF) 300 MG capsule, Take 1 capsule (300 mg total) by mouth 2 (two) times daily for 7 days., Disp: 14 capsule, Rfl: 0 .  citalopram (CELEXA) 20 MG tablet, Take 1 tablet (20 mg total) by mouth daily., Disp: 90 tablet, Rfl: 3 .  diphenoxylate-atropine (LOMOTIL) 2.5-0.025 MG tablet, Take 1 tablet by mouth 4 (four) times daily as needed for diarrhea or loose stools. (Patient not taking: Reported on 12/22/2019), Disp: 40 tablet, Rfl: 0 .  Fluticasone-Salmeterol (ADVAIR DISKUS) 250-50 MCG/DOSE AEPB, Inhale 1 puff into the lungs 2 (two) times daily., Disp: 3 each, Rfl: 3 .  gabapentin (NEURONTIN) 100 MG capsule, Take 1-3 capsules (100-300 mg total) by mouth at bedtime. For nerve pain (Patient not taking: Reported on 12/22/2019), Disp: 30 capsule, Rfl: 3 .  hydrochlorothiazide (HYDRODIURIL) 25 MG tablet, TAKE 1 TABLET DAILY (Patient taking differently: Take 25 mg by mouth daily. ), Disp: 90 tablet, Rfl: 3 .  iron polysaccharides (NU-IRON) 150 MG capsule, Take 1 capsule (150 mg total) by mouth daily. (Patient taking differently: Take 150 mg by mouth See admin instructions. Takes 2 times a week), Disp: 90 capsule, Rfl: 1 .  losartan (COZAAR) 100  MG tablet, Take 1 tablet (100 mg total) by mouth daily., Disp: 90 tablet, Rfl: 3 .  montelukast (SINGULAIR) 10 MG tablet, Take 1 tablet (10 mg total) by mouth at bedtime., Disp: 90 tablet, Rfl: 1 .  ondansetron (ZOFRAN ODT) 4 MG disintegrating tablet, Take 1 tablet (4 mg total) by mouth every 8 (eight) hours as needed for nausea or vomiting., Disp: 20 tablet, Rfl: 0 .  pantoprazole (PROTONIX) 40 MG tablet, Take 1 tablet (40 mg total) by mouth daily., Disp: 90 tablet, Rfl: 3 .  potassium chloride (KLOR-CON) 10 MEQ tablet, Take 1 tablet (10 mEq total) by mouth daily., Disp: 90 tablet, Rfl: 3  EXAM:  VITALS per patient if applicable:  GENERAL: alert, oriented, appears well and in no acute distress  HEENT: atraumatic, conjunttiva clear, no obvious abnormalities on inspection of external nose and ears  NECK: normal movements of the head and neck  LUNGS: on inspection no signs of respiratory distress, breathing rate appears normal, no obvious gross SOB, gasping or wheezing  CV: no obvious cyanosis  MS: moves all visible extremities without noticeable abnormality  PSYCH/NEURO: pleasant and cooperative, no obvious depression or anxiety, speech and thought processing grossly intact  ASSESSMENT AND PLAN:  Discussed the following assessment and plan:  Nausea and vomiting, intractability of vomiting not specified, unspecified vomiting type  Diarrhea, unspecified type  -we discussed possible serious and likely etiologies, options for evaluation and workup, limitations of telemedicine visit vs in person visit, treatment, treatment risks and precautions. Pt prefers to treat via telemedicine empirically rather than in person at this moment.  Given duration of symptoms, discussed possible gastroenteritis, antibiotic side effects, C. difficile colitis versus other.  They prefer to try a treatment for the nausea and diarrhea along with diet changes, before in person evaluation.  Daughter feels has had  the symptoms in the past.  Zofran was helpful.  Opted for Zofran for nausea, Imodium, and advised no dairy or red meat for 1 week.  Advised low threshold to seek in person care if worsening or if symptoms do not resolve over the next 24 to 48 hours.  Daughter and patient in agreement with this plan. They reported that they cannot be seen at the primary care office due to symptoms that overlap with Covid. Discussed options for inperson care if PCP office not available. Did let this patient know that I only do telemedicine on Tuesdays and Thursdays for [place]. Advised to schedule follow up visit with PCP or [facility] if any further questions or concerns to avoid delays in care.   I discussed the assessment and treatment plan with the patient. The patient was provided an opportunity to ask questions and all were answered. The patient agreed with the plan and demonstrated an understanding of the instructions.     Terressa Koyanagi, DO

## 2020-01-16 ENCOUNTER — Other Ambulatory Visit: Payer: Self-pay

## 2020-01-16 ENCOUNTER — Encounter (HOSPITAL_BASED_OUTPATIENT_CLINIC_OR_DEPARTMENT_OTHER): Payer: Self-pay | Admitting: *Deleted

## 2020-01-16 ENCOUNTER — Inpatient Hospital Stay (HOSPITAL_BASED_OUTPATIENT_CLINIC_OR_DEPARTMENT_OTHER)
Admission: EM | Admit: 2020-01-16 | Discharge: 2020-01-21 | DRG: 392 | Disposition: A | Discharge status: Home / Self Care | Payer: Medicare Other | Attending: Family Medicine | Admitting: Family Medicine

## 2020-01-16 ENCOUNTER — Emergency Department (HOSPITAL_BASED_OUTPATIENT_CLINIC_OR_DEPARTMENT_OTHER): Payer: Medicare Other

## 2020-01-16 DIAGNOSIS — K7689 Other specified diseases of liver: Secondary | ICD-10-CM | POA: Diagnosis not present

## 2020-01-16 DIAGNOSIS — J439 Emphysema, unspecified: Secondary | ICD-10-CM | POA: Diagnosis not present

## 2020-01-16 DIAGNOSIS — Z20822 Contact with and (suspected) exposure to covid-19: Secondary | ICD-10-CM | POA: Diagnosis present

## 2020-01-16 DIAGNOSIS — M8580 Other specified disorders of bone density and structure, unspecified site: Secondary | ICD-10-CM | POA: Diagnosis present

## 2020-01-16 DIAGNOSIS — M48061 Spinal stenosis, lumbar region without neurogenic claudication: Secondary | ICD-10-CM | POA: Diagnosis present

## 2020-01-16 DIAGNOSIS — J189 Pneumonia, unspecified organism: Secondary | ICD-10-CM

## 2020-01-16 DIAGNOSIS — J449 Chronic obstructive pulmonary disease, unspecified: Secondary | ICD-10-CM | POA: Diagnosis present

## 2020-01-16 DIAGNOSIS — R1110 Vomiting, unspecified: Secondary | ICD-10-CM | POA: Diagnosis not present

## 2020-01-16 DIAGNOSIS — R4182 Altered mental status, unspecified: Secondary | ICD-10-CM | POA: Diagnosis present

## 2020-01-16 DIAGNOSIS — K219 Gastro-esophageal reflux disease without esophagitis: Secondary | ICD-10-CM | POA: Diagnosis present

## 2020-01-16 DIAGNOSIS — I10 Essential (primary) hypertension: Secondary | ICD-10-CM | POA: Diagnosis present

## 2020-01-16 DIAGNOSIS — Z818 Family history of other mental and behavioral disorders: Secondary | ICD-10-CM

## 2020-01-16 DIAGNOSIS — Z87891 Personal history of nicotine dependence: Secondary | ICD-10-CM | POA: Diagnosis not present

## 2020-01-16 DIAGNOSIS — R54 Age-related physical debility: Secondary | ICD-10-CM | POA: Diagnosis present

## 2020-01-16 DIAGNOSIS — R112 Nausea with vomiting, unspecified: Secondary | ICD-10-CM | POA: Diagnosis present

## 2020-01-16 DIAGNOSIS — E860 Dehydration: Secondary | ICD-10-CM | POA: Diagnosis not present

## 2020-01-16 DIAGNOSIS — M4316 Spondylolisthesis, lumbar region: Secondary | ICD-10-CM | POA: Diagnosis not present

## 2020-01-16 DIAGNOSIS — R1032 Left lower quadrant pain: Secondary | ICD-10-CM | POA: Diagnosis not present

## 2020-01-16 DIAGNOSIS — F0390 Unspecified dementia without behavioral disturbance: Secondary | ICD-10-CM | POA: Diagnosis present

## 2020-01-16 DIAGNOSIS — I959 Hypotension, unspecified: Secondary | ICD-10-CM | POA: Diagnosis present

## 2020-01-16 DIAGNOSIS — R52 Pain, unspecified: Secondary | ICD-10-CM | POA: Diagnosis not present

## 2020-01-16 DIAGNOSIS — Z23 Encounter for immunization: Secondary | ICD-10-CM | POA: Diagnosis present

## 2020-01-16 DIAGNOSIS — E876 Hypokalemia: Secondary | ICD-10-CM

## 2020-01-16 DIAGNOSIS — J45909 Unspecified asthma, uncomplicated: Secondary | ICD-10-CM | POA: Diagnosis not present

## 2020-01-16 DIAGNOSIS — Z801 Family history of malignant neoplasm of trachea, bronchus and lung: Secondary | ICD-10-CM

## 2020-01-16 DIAGNOSIS — K5792 Diverticulitis of intestine, part unspecified, without perforation or abscess without bleeding: Secondary | ICD-10-CM | POA: Diagnosis present

## 2020-01-16 DIAGNOSIS — E785 Hyperlipidemia, unspecified: Secondary | ICD-10-CM | POA: Diagnosis present

## 2020-01-16 DIAGNOSIS — I5030 Unspecified diastolic (congestive) heart failure: Secondary | ICD-10-CM | POA: Diagnosis not present

## 2020-01-16 DIAGNOSIS — I110 Hypertensive heart disease with heart failure: Secondary | ICD-10-CM | POA: Diagnosis present

## 2020-01-16 DIAGNOSIS — F32A Depression, unspecified: Secondary | ICD-10-CM | POA: Diagnosis present

## 2020-01-16 DIAGNOSIS — R0689 Other abnormalities of breathing: Secondary | ICD-10-CM | POA: Diagnosis not present

## 2020-01-16 DIAGNOSIS — K760 Fatty (change of) liver, not elsewhere classified: Secondary | ICD-10-CM | POA: Diagnosis not present

## 2020-01-16 DIAGNOSIS — R110 Nausea: Secondary | ICD-10-CM | POA: Diagnosis not present

## 2020-01-16 DIAGNOSIS — F419 Anxiety disorder, unspecified: Secondary | ICD-10-CM | POA: Diagnosis present

## 2020-01-16 DIAGNOSIS — R1084 Generalized abdominal pain: Secondary | ICD-10-CM | POA: Diagnosis not present

## 2020-01-16 DIAGNOSIS — J90 Pleural effusion, not elsewhere classified: Secondary | ICD-10-CM | POA: Diagnosis not present

## 2020-01-16 DIAGNOSIS — Z79899 Other long term (current) drug therapy: Secondary | ICD-10-CM | POA: Diagnosis not present

## 2020-01-16 DIAGNOSIS — R0902 Hypoxemia: Secondary | ICD-10-CM | POA: Diagnosis not present

## 2020-01-16 HISTORY — DX: Unspecified hearing loss, unspecified ear: H91.90

## 2020-01-16 LAB — BASIC METABOLIC PANEL
Anion gap: 11 (ref 5–15)
BUN: 14 mg/dL (ref 8–23)
CO2: 25 mmol/L (ref 22–32)
Calcium: 7.2 mg/dL — ABNORMAL LOW (ref 8.9–10.3)
Chloride: 102 mmol/L (ref 98–111)
Creatinine, Ser: 1.57 mg/dL — ABNORMAL HIGH (ref 0.44–1.00)
GFR, Estimated: 33 mL/min — ABNORMAL LOW (ref >60)
Glucose, Bld: 97 mg/dL (ref 70–99)
Potassium: 2.7 mmol/L — CL (ref 3.5–5.1)
Sodium: 138 mmol/L (ref 135–145)

## 2020-01-16 LAB — HEPATIC FUNCTION PANEL
ALT: 12 U/L (ref 0–44)
AST: 14 U/L — ABNORMAL LOW (ref 15–41)
Albumin: 2.8 g/dL — ABNORMAL LOW (ref 3.5–5.0)
Alkaline Phosphatase: 39 U/L (ref 38–126)
Bilirubin, Direct: 0.2 mg/dL (ref 0.0–0.2)
Indirect Bilirubin: 0.7 mg/dL (ref 0.3–0.9)
Total Bilirubin: 0.9 mg/dL (ref 0.3–1.2)
Total Protein: 5.5 g/dL — ABNORMAL LOW (ref 6.5–8.1)

## 2020-01-16 LAB — RESP PANEL BY RT-PCR (FLU A&B, COVID) ARPGX2
Influenza A by PCR: NEGATIVE
Influenza B by PCR: NEGATIVE
SARS Coronavirus 2 by RT PCR: NEGATIVE

## 2020-01-16 LAB — CBC WITH DIFFERENTIAL/PLATELET
Abs Immature Granulocytes: 0.07 10*3/uL (ref 0.00–0.07)
Basophils Absolute: 0 10*3/uL (ref 0.0–0.1)
Basophils Relative: 0 %
Eosinophils Absolute: 0 10*3/uL (ref 0.0–0.5)
Eosinophils Relative: 0 %
HCT: 33.8 % — ABNORMAL LOW (ref 36.0–46.0)
Hemoglobin: 11.2 g/dL — ABNORMAL LOW (ref 12.0–15.0)
Immature Granulocytes: 1 %
Lymphocytes Relative: 21 %
Lymphs Abs: 2.4 10*3/uL (ref 0.7–4.0)
MCH: 30 pg (ref 26.0–34.0)
MCHC: 33.1 g/dL (ref 30.0–36.0)
MCV: 90.6 fL (ref 80.0–100.0)
Monocytes Absolute: 1.5 10*3/uL — ABNORMAL HIGH (ref 0.1–1.0)
Monocytes Relative: 13 %
Neutro Abs: 7.6 10*3/uL (ref 1.7–7.7)
Neutrophils Relative %: 65 %
Platelets: 290 10*3/uL (ref 150–400)
RBC: 3.73 MIL/uL — ABNORMAL LOW (ref 3.87–5.11)
RDW: 13.5 % (ref 11.5–15.5)
WBC: 11.7 10*3/uL — ABNORMAL HIGH (ref 4.0–10.5)
nRBC: 0 % (ref 0.0–0.2)

## 2020-01-16 LAB — LIPASE, BLOOD: Lipase: 23 U/L (ref 11–51)

## 2020-01-16 LAB — TROPONIN I (HIGH SENSITIVITY): Troponin I (High Sensitivity): 27 ng/L — ABNORMAL HIGH (ref <18)

## 2020-01-16 MED ORDER — SODIUM CHLORIDE 0.9 % IV BOLUS
1000.0000 mL | Freq: Once | INTRAVENOUS | Status: AC
  Administered 2020-01-16: 21:00:00 1000 mL via INTRAVENOUS

## 2020-01-16 NOTE — ED Triage Notes (Signed)
Pt reports abdominal pain along with n/v and feeling light headed. Per family pts BP was noted to be low. Pt was placed on oxygen by EMS.

## 2020-01-16 NOTE — ED Notes (Signed)
Lab needs recollect on blood due to short samples.

## 2020-01-16 NOTE — ED Provider Notes (Signed)
[facility] EMERGENCY DEPARTMENT Provider Note   CSN: [id number] Arrival date & time: 01/16/20  1447     History Chief Complaint  Patient presents with  . Abdominal Pain    Desiree [name] is a 84 y.o. female with history of diverticulitis, hypertension, hyperlipidemia, presenting to emergency department with abdominal pain and nausea.  Patient was seen emerge department earlier this month for cough, diagnosed with acute bronchitis, treated with steroids as well as several rounds of antibiotics, her daughter at bedside reporting that she had been on Levaquin and then [facility].  She has been having diarrhea intermittently since the start of the month.  Today the patient called her daughter complained that she felt generally weak, was also nauseous and had a poor appetite.  Since arriving in the emergency department the patient reports that she has not had a single bowel movement today.  She was complaining of some sharp left lower quadrant abdominal pain earlier, which has resolved.  Her daughter reports the pain had her "doubled over".  The patient does have a history of diverticulitis.  Patient on losartan for HTN at home Does not use home O2 Denies cough, congestion  Has had covid vaccine x 2 and booster  HPI     Past Medical History:  Diagnosis Date  . Anxiety 02/13/2017  . Asthma   . B12 deficiency anemia 02/13/2017  . Chronic back pain 02/13/2017  . Chronic cough 02/13/2017  . COPD (chronic obstructive pulmonary disease) (HCC)   . Depression 02/13/2017  . Diverticulosis 02/13/2017  . Former smoker 2001  . GERD (gastroesophageal reflux disease) 02/13/2017  . HLD (hyperlipidemia) 02/13/2017  . HTN (hypertension) 02/13/2017  . Lumbar compression fracture (HCC)   . Osteopenia 02/13/2017  . 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   Marland Kitchen Spinal stenosis 02/13/2017    Patient Active  Problem List   Diagnosis Date Noted  . 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 Diarrhea 03/18/2019  . Lower abdominal pain 03/18/2019  . AKI (acute kidney injury) (HCC) 03/18/2019  . Degenerative arthritis of right knee 12/23/2018  . Baker's cyst of knee, right 12/23/2018  . Right knee pain 12/22/2018  . Insomnia 07/04/2018  . B12 deficiency 07/04/2018  . Iron deficiency 07/03/2018  . Wheezing 02/26/2018  . Allergic rhinitis 12/25/2017  . COPD exacerbation (HCC) 02/13/2017  . Encounter for well adult exam with abnormal findings 02/13/2017  . Hyperglycemia 02/13/2017  . HLD (hyperlipidemia) 02/13/2017  . B12 deficiency anemia 02/13/2017  . Chronic back pain 02/13/2017  . Depression 02/13/2017  . Diverticulosis 02/13/2017  . Anxiety 02/13/2017  . Chronic cough 02/13/2017  . Spinal stenosis 02/13/2017  . Osteopenia 02/13/2017  . GERD (gastroesophageal reflux disease) 02/13/2017  . HTN (hypertension) 02/13/2017  . COPD (chronic obstructive pulmonary disease) (HCC)   . Asthma   . Lumbar compression fracture [facility])     Past Surgical History:  Procedure Laterality Date  . APPENDECTOMY    . CATARACT EXTRACTION, BILATERAL    . KYPHOPLASTY  2010  . left hip surgury    . right hip surgury       OB History   No obstetric history on file.     Family History  Problem Relation Age of Onset  . Depression Father   . Lung cancer Sister   . Lung cancer Brother   . Mesothelioma Son  Social History   Tobacco Use  . Smoking status: Former Games developer  . Smokeless tobacco: Never Used  Vaping Use  . Vaping Use: Never used  Substance Use Topics  . Alcohol use: Yes  . Drug use: No    Home Medications Prior to Admission medications   Medication Sig Start Date End Date Taking? Authorizing Provider  albuterol (PROVENTIL) (2.5 MG/3ML) 0.083% nebulizer solution Take 3 mLs (2.5 mg total) by nebulization every 6 (six) hours as needed for wheezing or shortness of breath. 03/18/19    Corwin Levins, MD  amoxicillin-clavulanate (AUGMENTIN) 875-125 MG tablet Take 1 tablet by mouth every 12 (twelve) hours. Patient taking differently: Take 1 tablet by mouth every 12 (twelve) hours. Start date : 12/20/19 12/19/19   Merrilee Jansky, MD  benzonatate (TESSALON) 100 MG capsule Take 1 capsule (100 mg total) by mouth every 8 (eight) hours. 12/19/19   LampteyBritta Mccreedy, MD  citalopram (CELEXA) 20 MG tablet Take 1 tablet (20 mg total) by mouth daily. 04/08/19   Corwin Levins, MD  diphenoxylate-atropine (LOMOTIL) 2.5-0.025 MG tablet Take 1 tablet by mouth 4 (four) times daily as needed for diarrhea or loose stools. Patient not taking: Reported on 12/22/2019 03/23/19   Corwin Levins, MD  Fluticasone-Salmeterol (ADVAIR DISKUS) 250-50 MCG/DOSE AEPB Inhale 1 puff into the lungs 2 (two) times daily. 05/05/19 05/04/20  Corwin Levins, MD  gabapentin (NEURONTIN) 100 MG capsule Take 1-3 capsules (100-300 mg total) by mouth at bedtime. For nerve pain Patient not taking: Reported on 12/22/2019 07/29/19   Rodolph Bong, MD  hydrochlorothiazide (HYDRODIURIL) 25 MG tablet TAKE 1 TABLET DAILY Patient taking differently: Take 25 mg by mouth daily.  05/26/19   Corwin Levins, MD  iron polysaccharides (NU-IRON) 150 MG capsule Take 1 capsule (150 mg total) by mouth daily. Patient taking differently: Take 150 mg by mouth See admin instructions. Takes 2 times a week 07/05/18   Corwin Levins, MD  losartan (COZAAR) 100 MG tablet Take 1 tablet (100 mg total) by mouth daily. 05/25/19   Corwin Levins, MD  montelukast (SINGULAIR) 10 MG tablet Take 1 tablet (10 mg total) by mouth at bedtime. 05/20/19   Corwin Levins, MD  ondansetron (ZOFRAN ODT) 4 MG disintegrating tablet Take 1 tablet (4 mg total) by mouth every 8 (eight) hours as needed for nausea or vomiting. 12/29/19   Terressa Koyanagi, DO  pantoprazole (PROTONIX) 40 MG tablet Take 1 tablet (40 mg total) by mouth daily. 03/18/19   Corwin Levins, MD  potassium chloride (KLOR-CON) 10  MEQ tablet Take 1 tablet (10 mEq total) by mouth daily. 12/23/18   Corwin Levins, MD    Allergies    Doxycycline, Levaquin [levofloxacin], Mucinex [guaifenesin er], Percocet [oxycodone-acetaminophen], Prednisone, and Azithromycin  Review of Systems   Review of Systems  Constitutional: Negative for chills and fever.  HENT: Negative for ear pain and sore throat.   Eyes: Negative for photophobia and visual disturbance.  Respiratory: Negative for cough and shortness of breath.   Cardiovascular: Negative for chest pain and palpitations.  Gastrointestinal: Positive for abdominal pain and nausea.  Musculoskeletal: Negative for arthralgias and myalgias.  Skin: Negative for color change and rash.  Neurological: Positive for light-headedness. Negative for syncope.  All other systems reviewed and are negative.   Physical Exam Updated Vital Signs BP (!) 102/54 (BP Location: Right Arm) Comment: TAKEN AT 20:09  Pulse 64   Temp 98.7 F (37.1 C) (  Oral)   Resp 20   Ht 5\' 3"  (1.6 m)   Wt 68.5 kg   SpO2 100%   BMI 26.75 kg/m   Physical Exam Vitals and nursing note reviewed.  Constitutional:      General: She is not in acute distress.    Appearance: She is well-developed and well-nourished.  HENT:     Head: Normocephalic and atraumatic.  Eyes:     Conjunctiva/sclera: Conjunctivae normal.  Cardiovascular:     Rate and Rhythm: Normal rate and regular rhythm.     Heart sounds: Normal heart sounds.  Pulmonary:     Effort: Pulmonary effort is normal. No respiratory distress.  Abdominal:     Palpations: Abdomen is soft.     Tenderness: There is abdominal tenderness in the left lower quadrant.  Musculoskeletal:        General: No edema.     Cervical back: Neck supple.  Skin:    General: Skin is warm and dry.  Neurological:     Mental Status: She is alert.  Psychiatric:        Mood and Affect: Mood and affect normal.     ED Results / Procedures / Treatments   Labs (all labs ordered  are listed, but only abnormal results are displayed) Labs Reviewed  RESP PANEL BY RT-PCR (FLU A&B, COVID) [id number]    EKG None  Radiology No results found.  Procedures Procedures (including critical care time)  Medications Ordered in ED Medications - No data to display  ED Course  I have reviewed the triage vital signs and the nursing notes.  Pertinent labs & imaging results that were available during my care of the patient were reviewed by me and considered in my medical decision making (see chart for details).  This patient presents to the Emergency Department with complaint of abdominal pain. This involves an extensive number of treatment options, and is a complaint that carries with it a high risk of complications and morbidity.  The differential diagnosis includes gastritis vs constipation vs inflammatory bowel disease vs intraabdominal infection vs diverticulitis vs other  Less likely AAA - no prior hx, no smoking, no risk factors  I ordered, reviewed, and interpreted labs, including WBC 11.7, Trop 27, Cr 1.57, K 2.7, lipase 23 I ordered medication IV fluids for dehydration I ordered imaging studies which included CT abd/pelvis without contrast (to minimize effect on kidney)  Additional history was obtained from daughter at bedside Previous records obtained and reviewed showing recent ED visits I personally reviewed the patients ECG which showed no acute ischemic findings  Patient signed out to evening EDP Dr with plan to f/u on CT results     Final Clinical Impression(s) / ED Diagnoses Final diagnoses:  None    Rx / DC Orders ED Discharge Orders    None       Emalie Mcwethy, Rubin Payor, MD 01/17/20 0040

## 2020-01-17 ENCOUNTER — Encounter (HOSPITAL_BASED_OUTPATIENT_CLINIC_OR_DEPARTMENT_OTHER): Payer: Self-pay | Admitting: *Deleted

## 2020-01-17 DIAGNOSIS — R4182 Altered mental status, unspecified: Secondary | ICD-10-CM | POA: Diagnosis present

## 2020-01-17 DIAGNOSIS — J449 Chronic obstructive pulmonary disease, unspecified: Secondary | ICD-10-CM | POA: Diagnosis present

## 2020-01-17 DIAGNOSIS — K5792 Diverticulitis of intestine, part unspecified, without perforation or abscess without bleeding: Secondary | ICD-10-CM | POA: Diagnosis present

## 2020-01-17 DIAGNOSIS — R112 Nausea with vomiting, unspecified: Secondary | ICD-10-CM | POA: Diagnosis present

## 2020-01-17 DIAGNOSIS — K219 Gastro-esophageal reflux disease without esophagitis: Secondary | ICD-10-CM | POA: Diagnosis present

## 2020-01-17 DIAGNOSIS — M8580 Other specified disorders of bone density and structure, unspecified site: Secondary | ICD-10-CM | POA: Diagnosis present

## 2020-01-17 DIAGNOSIS — M48061 Spinal stenosis, lumbar region without neurogenic claudication: Secondary | ICD-10-CM | POA: Diagnosis present

## 2020-01-17 DIAGNOSIS — I959 Hypotension, unspecified: Secondary | ICD-10-CM | POA: Diagnosis present

## 2020-01-17 DIAGNOSIS — E785 Hyperlipidemia, unspecified: Secondary | ICD-10-CM | POA: Diagnosis present

## 2020-01-17 DIAGNOSIS — Z818 Family history of other mental and behavioral disorders: Secondary | ICD-10-CM | POA: Diagnosis not present

## 2020-01-17 DIAGNOSIS — E876 Hypokalemia: Secondary | ICD-10-CM | POA: Diagnosis present

## 2020-01-17 DIAGNOSIS — Z23 Encounter for immunization: Secondary | ICD-10-CM | POA: Diagnosis present

## 2020-01-17 DIAGNOSIS — R54 Age-related physical debility: Secondary | ICD-10-CM | POA: Diagnosis present

## 2020-01-17 DIAGNOSIS — I10 Essential (primary) hypertension: Secondary | ICD-10-CM | POA: Diagnosis present

## 2020-01-17 DIAGNOSIS — Z801 Family history of malignant neoplasm of trachea, bronchus and lung: Secondary | ICD-10-CM | POA: Diagnosis not present

## 2020-01-17 DIAGNOSIS — Z87891 Personal history of nicotine dependence: Secondary | ICD-10-CM | POA: Diagnosis not present

## 2020-01-17 DIAGNOSIS — F32A Depression, unspecified: Secondary | ICD-10-CM | POA: Diagnosis present

## 2020-01-17 DIAGNOSIS — F0390 Unspecified dementia without behavioral disturbance: Secondary | ICD-10-CM | POA: Diagnosis present

## 2020-01-17 DIAGNOSIS — F419 Anxiety disorder, unspecified: Secondary | ICD-10-CM | POA: Diagnosis present

## 2020-01-17 DIAGNOSIS — Z20822 Contact with and (suspected) exposure to covid-19: Secondary | ICD-10-CM | POA: Diagnosis present

## 2020-01-17 DIAGNOSIS — Z79899 Other long term (current) drug therapy: Secondary | ICD-10-CM | POA: Diagnosis not present

## 2020-01-17 DIAGNOSIS — I110 Hypertensive heart disease with heart failure: Secondary | ICD-10-CM | POA: Diagnosis present

## 2020-01-17 DIAGNOSIS — I5030 Unspecified diastolic (congestive) heart failure: Secondary | ICD-10-CM | POA: Diagnosis not present

## 2020-01-17 LAB — BASIC METABOLIC PANEL
Anion gap: 9 (ref 5–15)
BUN: 20 mg/dL (ref 8–23)
CO2: 28 mmol/L (ref 22–32)
Calcium: 7.8 mg/dL — ABNORMAL LOW (ref 8.9–10.3)
Chloride: 102 mmol/L (ref 98–111)
Creatinine, Ser: 1.13 mg/dL — ABNORMAL HIGH (ref 0.44–1.00)
GFR, Estimated: 49 mL/min — ABNORMAL LOW (ref >60)
Glucose, Bld: 94 mg/dL (ref 70–99)
Potassium: 3.3 mmol/L — ABNORMAL LOW (ref 3.5–5.1)
Sodium: 139 mmol/L (ref 135–145)

## 2020-01-17 LAB — TROPONIN I (HIGH SENSITIVITY): Troponin I (High Sensitivity): 15 ng/L (ref <18)

## 2020-01-17 LAB — MAGNESIUM: Magnesium: 1.6 mg/dL — ABNORMAL LOW (ref 1.7–2.4)

## 2020-01-17 MED ORDER — ACETAMINOPHEN 650 MG RE SUPP: 650.0000 mg | Freq: Four times a day (QID) | RECTAL | Status: DC | PRN

## 2020-01-17 MED ORDER — MAGNESIUM SULFATE 2 GM/50ML IV SOLN
2.0000 g | Freq: Once | INTRAVENOUS | Status: AC
  Administered 2020-01-17: 18:00:00 2 g via INTRAVENOUS
  Filled 2020-01-17: qty 50

## 2020-01-17 MED ORDER — MORPHINE SULFATE (PF) 2 MG/ML IV SOLN: 2.0000 mg | INTRAVENOUS | Status: DC | PRN

## 2020-01-17 MED ORDER — MELATONIN 5 MG PO TABS: 10.0000 mg | ORAL_TABLET | Freq: Every day | ORAL | Status: DC

## 2020-01-17 MED ORDER — MONTELUKAST SODIUM 10 MG PO TABS
10.0000 mg | ORAL_TABLET | Freq: Every day | ORAL | Status: DC
  Administered 2020-01-17 – 2020-01-20 (×4): 10 mg via ORAL
  Filled 2020-01-17 (×4): qty 1

## 2020-01-17 MED ORDER — POLYSACCHARIDE IRON COMPLEX 150 MG PO CAPS
150.0000 mg | ORAL_CAPSULE | ORAL | Status: DC
  Administered 2020-01-19: 09:00:00 150 mg via ORAL
  Filled 2020-01-17: qty 1

## 2020-01-17 MED ORDER — MELATONIN 5 MG PO TABS
10.0000 mg | ORAL_TABLET | Freq: Every day | ORAL | Status: DC
  Administered 2020-01-17 – 2020-01-20 (×4): 10 mg via ORAL
  Filled 2020-01-17 (×4): qty 2

## 2020-01-17 MED ORDER — LACTATED RINGERS IV BOLUS
1000.0000 mL | Freq: Once | INTRAVENOUS | Status: AC
  Administered 2020-01-17: 02:00:00 1000 mL via INTRAVENOUS

## 2020-01-17 MED ORDER — SODIUM CHLORIDE 0.9 % IV SOLN
3.0000 g | Freq: Four times a day (QID) | INTRAVENOUS | Status: DC
  Administered 2020-01-17 – 2020-01-18 (×3): 3 g via INTRAVENOUS
  Filled 2020-01-17 (×2): qty 3
  Filled 2020-01-17 (×2): qty 8

## 2020-01-17 MED ORDER — POTASSIUM CHLORIDE 10 MEQ/100ML IV SOLN
10.0000 meq | Freq: Once | INTRAVENOUS | Status: AC
  Administered 2020-01-17: 01:00:00 10 meq via INTRAVENOUS
  Filled 2020-01-17: qty 100

## 2020-01-17 MED ORDER — ONDANSETRON HCL 4 MG/2ML IJ SOLN
4.0000 mg | Freq: Four times a day (QID) | INTRAMUSCULAR | Status: DC | PRN
  Administered 2020-01-18: 15:00:00 4 mg via INTRAVENOUS
  Filled 2020-01-17: qty 2

## 2020-01-17 MED ORDER — PANTOPRAZOLE SODIUM 40 MG PO TBEC
40.0000 mg | DELAYED_RELEASE_TABLET | Freq: Every day | ORAL | Status: DC
Start: 2020-01-17 — End: 2020-01-21
  Administered 2020-01-17 – 2020-01-21 (×5): 40 mg via ORAL
  Filled 2020-01-17 (×6): qty 1

## 2020-01-17 MED ORDER — GABAPENTIN 100 MG PO CAPS
300.0000 mg | ORAL_CAPSULE | Freq: Every day | ORAL | Status: DC
  Filled 2020-01-17 (×4): qty 3

## 2020-01-17 MED ORDER — LACTATED RINGERS IV SOLN: INTRAVENOUS | Status: DC

## 2020-01-17 MED ORDER — SACCHAROMYCES BOULARDII 250 MG PO CAPS
250.0000 mg | ORAL_CAPSULE | Freq: Two times a day (BID) | ORAL | Status: DC
  Administered 2020-01-17 – 2020-01-21 (×8): 250 mg via ORAL
  Filled 2020-01-17 (×8): qty 1

## 2020-01-17 MED ORDER — ENOXAPARIN SODIUM 40 MG/0.4ML ~~LOC~~ SOLN
40.0000 mg | SUBCUTANEOUS | Status: DC
  Administered 2020-01-17 – 2020-01-20 (×4): 40 mg via SUBCUTANEOUS
  Filled 2020-01-17 (×4): qty 0.4

## 2020-01-17 MED ORDER — ALBUTEROL SULFATE (2.5 MG/3ML) 0.083% IN NEBU: 2.5000 mg | INHALATION_SOLUTION | Freq: Four times a day (QID) | RESPIRATORY_TRACT | Status: DC | PRN

## 2020-01-17 MED ORDER — ACETAMINOPHEN 325 MG PO TABS: 650.0000 mg | ORAL_TABLET | Freq: Four times a day (QID) | ORAL | Status: DC | PRN

## 2020-01-17 MED ORDER — SODIUM CHLORIDE 0.9% FLUSH
3.0000 mL | Freq: Two times a day (BID) | INTRAVENOUS | Status: DC
  Administered 2020-01-19 – 2020-01-20 (×4): 3 mL via INTRAVENOUS

## 2020-01-17 MED ORDER — FENTANYL CITRATE (PF) 100 MCG/2ML IJ SOLN: 12.5000 ug | INTRAMUSCULAR | Status: DC | PRN

## 2020-01-17 MED ORDER — ONDANSETRON HCL 4 MG/2ML IJ SOLN: 4.0000 mg | Freq: Four times a day (QID) | INTRAMUSCULAR | Status: DC | PRN

## 2020-01-17 MED ORDER — SODIUM CHLORIDE 0.9 % IV SOLN: INTRAVENOUS | Status: DC | PRN

## 2020-01-17 MED ORDER — POTASSIUM CHLORIDE CRYS ER 20 MEQ PO TBCR
40.0000 meq | EXTENDED_RELEASE_TABLET | ORAL | Status: AC
  Administered 2020-01-17: 18:00:00 40 meq via ORAL
  Filled 2020-01-17 (×3): qty 2

## 2020-01-17 MED ORDER — CITALOPRAM HYDROBROMIDE 20 MG PO TABS
20.0000 mg | ORAL_TABLET | Freq: Every day | ORAL | Status: DC
Start: 2020-01-17 — End: 2020-01-21
  Administered 2020-01-17 – 2020-01-21 (×5): 20 mg via ORAL
  Filled 2020-01-17 (×5): qty 1

## 2020-01-17 MED ORDER — ENSURE ENLIVE PO LIQD
237.0000 mL | Freq: Two times a day (BID) | ORAL | Status: DC
  Administered 2020-01-18 – 2020-01-21 (×7): 237 mL via ORAL

## 2020-01-17 MED ORDER — ONDANSETRON HCL 4 MG PO TABS: 4.0000 mg | ORAL_TABLET | Freq: Four times a day (QID) | ORAL | Status: DC | PRN

## 2020-01-17 MED ORDER — MOMETASONE FURO-FORMOTEROL FUM 200-5 MCG/ACT IN AERO
2.0000 | INHALATION_SPRAY | Freq: Two times a day (BID) | RESPIRATORY_TRACT | Status: DC
  Administered 2020-01-17 – 2020-01-21 (×8): 2 via RESPIRATORY_TRACT
  Filled 2020-01-17: qty 8.8

## 2020-01-17 MED ORDER — SODIUM CHLORIDE 0.9 % IV SOLN
3.0000 g | Freq: Once | INTRAVENOUS | Status: AC
  Administered 2020-01-17: 02:00:00 3 g via INTRAVENOUS
  Filled 2020-01-17: qty 8

## 2020-01-17 NOTE — ED Provider Notes (Signed)
  Physical Exam  BP (!) 104/54   Pulse (!) 26   Temp 98.7 F (37.1 C) (Oral)   Resp 20   Ht 5\' 3"  (1.6 m)   Wt 68.5 kg   SpO2 97%   BMI 26.75 kg/m   Physical Exam  ED Course/Procedures     Procedures  MDM  Received patient in signout.  Has had vomiting for almost a month.  Has been on antibiotics and has been on steroids.  Had been diagnosed with bronchitis.  More generalized weakness today.  Lab work shows mildly worsened renal function.  Hypokalemia hypocalcemia.  CT scan done and showed diverticulitis.  With decreased oral intake diarrhea and electrolyte abnormalities I feels patient benefit for admission to the hospital.  Fluid boluses been given due to mild hypotension.  Has some dementia somewhat difficulty getting history but has been feeling weak.  Troponin mildly elevated.  I think more likely secondary to demand.  Negative Covid test       , MD 01/17/20 [phone]

## 2020-01-17 NOTE — ED Notes (Signed)
Date and time results received: 01/17/20 2344  Test: potassium Critical Value: 2.7  Name of Provider Notified: Dr. Rubin Payor  Orders Received? Or Actions Taken?: no new orders

## 2020-01-17 NOTE — ED Notes (Signed)
I spoke with Desiree [name], daughter and gave her the room number at [facility].  She has been extremely happy with the care here

## 2020-01-17 NOTE — Progress Notes (Signed)
Pharmacy Antibiotic Note  Desiree [name] is a 84 y.o. female admitted on 01/16/2020 with Intra-abdominal infection, diverticulitis.  Pharmacy has been consulted for Unasyn dosing.  Plan: Unasyn 3g IV q6h Follow up renal function, culture results if available, and clinical course.   Height: 5\' 3"  (160 cm) Weight: 68.5 kg (151 lb) IBW/kg (Calculated) : 52.4  Temp (24hrs), Avg:98.3 F (36.8 C), Min:97.9 F (36.6 C), Max:98.7 F (37.1 C)  Recent Labs  Lab 01/16/20 2300 01/17/20 1145  WBC 11.7*  --   CREATININE 1.57* 1.13*    Estimated Creatinine Clearance: 35.6 mL/min (A) (by C-G formula based on SCr of 1.13 mg/dL (H)).    Allergies  Allergen Reactions  . Doxycycline     rash  . Levaquin [Levofloxacin] Hives  . Mucinex [Guaifenesin Er] Other (See Comments)    breakout  . Percocet [Oxycodone-Acetaminophen] Hives  . Prednisone   . Azithromycin Rash    Antimicrobials this admission: 12/28 Unasyn >>   Dose adjustments this admission:   Microbiology results:  Thank you for allowing pharmacy to be a part of this patient's care. 1/29 PharmD, BCPS Clinical Pharmacist [facility] main pharmacy [phone] 01/17/2020 4:43 PM

## 2020-01-17 NOTE — ED Notes (Signed)
Cindy-pts daughter. [phone]

## 2020-01-17 NOTE — ED Notes (Signed)
[facility] here to transport pt, report reviewed again

## 2020-01-17 NOTE — H&P (Signed)
[facility] History and Physical   Patient: Desiree [name] DJS:[id number]   PCP: Corwin Levins, MD DOB: 1935-12-29   DOA: 01/16/2020   DOS: 01/17/2020   DOS: the patient was seen and examined on 01/17/2020  Patient coming from: The patient is coming from Home  Chief Complaint: Abdominal pain and diarrhea  HPI: Desiree [name] is a 84 y.o. female with Past medical history of anxiety, COPD, depression, diverticulosis, GERD, HLD, HTN. Patient presents with complaints of abdominal pain and diarrhea. In the end of November patient was seen in the urgent care for upper respiratory infection was treated with Augmentin. Continues to have symptoms and therefore she was treated with Omnicef. After that seen by PCP in the beginning of December for nausea and vomiting as well as diarrhea and she was given Zofran but no further work-up I am able to find in the chart. Patient presents today with complaints of ongoing nausea as well as occasional vomiting as well as ongoing diarrhea. No abdominal pain right now but had some abdominal pain before. No fever no chills. No chest pain.  No shortness of breath.  No cough. No blood in the stool reported.  ED Course: Presents with above complaint. CT scan shows diverticulitis without any perforation. Troponins were checked for some reason which were elevated.  Patient was referred for admission.  Review of Systems: as mentioned in the history of present illness.  All other systems reviewed and are negative.  Past Medical History:  Diagnosis Date  . Anxiety 02/13/2017  . Asthma   . B12 deficiency anemia 02/13/2017  . Chronic back pain 02/13/2017  . Chronic cough 02/13/2017  . COPD (chronic obstructive pulmonary disease) (HCC)   . Depression 02/13/2017  . Diverticulosis 02/13/2017  . Former smoker 2001  . GERD (gastroesophageal reflux disease) 02/13/2017  . Hard of hearing   . HLD (hyperlipidemia) 02/13/2017  . HTN (hypertension) 02/13/2017  . Lumbar  compression fracture (HCC)   . Osteopenia 02/13/2017  . 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   Marland Kitchen Spinal stenosis 02/13/2017   Past Surgical History:  Procedure Laterality Date  . APPENDECTOMY    . CATARACT EXTRACTION, BILATERAL    . KYPHOPLASTY  2010  . left hip surgury    . right hip surgury     Social History:  reports that she has quit smoking. She has never used smokeless tobacco. She reports current alcohol use. She reports that she does not use drugs.  Allergies  Allergen Reactions  . Doxycycline     rash  . Levaquin [Levofloxacin] Hives  . Mucinex [Guaifenesin Er] Other (See Comments)    breakout  . Percocet [Oxycodone-Acetaminophen] Hives  . Prednisone   . Azithromycin Rash    Family history reviewed and not pertinent Family History  Problem Relation Age of Onset  . Depression Father   . Lung cancer Sister   . Lung cancer Brother   . Mesothelioma Son      Prior to Admission medications   Medication Sig Start Date End Date Taking? Authorizing Provider  albuterol (PROVENTIL) (2.5 MG/3ML) 0.083% nebulizer solution Take 3 mLs (2.5 mg total) by nebulization every 6 (six) hours as needed for wheezing or shortness of breath. 03/18/19  Yes Corwin Levins, MD  citalopram (CELEXA) 20 MG tablet Take 1 tablet (20 mg total) by mouth daily. 04/08/19  Yes Corwin Levins, MD  Fluticasone-Salmeterol (ADVAIR DISKUS) 250-50 MCG/DOSE AEPB Inhale 1 puff into the lungs 2 (two) times daily. 05/05/19 05/04/20 Yes Corwin Levins, MD  gabapentin (NEURONTIN) 100 MG capsule Take 1-3 capsules (100-300 mg total) by mouth at bedtime. For nerve pain Patient taking differently: Take 100-300 mg by mouth daily as needed (pain). For nerve pain 07/29/19  Yes Rodolph Bong, MD  hydrochlorothiazide (HYDRODIURIL) 25 MG tablet TAKE 1 TABLET DAILY Patient taking differently: Take 25 mg by mouth daily. 05/26/19  Yes Corwin Levins, MD  iron polysaccharides (NU-IRON) 150 MG capsule Take 1 capsule (150 mg total) by mouth daily. Patient taking differently: Take 150 mg by mouth See admin instructions. Takes 2 times a week 07/05/18  Yes Corwin Levins, MD  losartan (COZAAR) 100 MG tablet Take 1 tablet (100 mg total) by mouth daily. 05/25/19  Yes Corwin Levins, MD  Melatonin 10 MG TABS Take 10 mg by mouth daily as needed.   Yes [provider]  montelukast (SINGULAIR) 10 MG tablet Take 1 tablet (10 mg total) by mouth at bedtime. 05/20/19  Yes Corwin Levins, MD  ondansetron (ZOFRAN ODT) 4 MG disintegrating tablet Take 1 tablet (4 mg total) by mouth every 8 (eight) hours as needed for nausea or vomiting. 12/29/19  Yes Kriste Basque R, DO  pantoprazole (PROTONIX) 40 MG tablet Take 1 tablet (40 mg total) by mouth daily. 03/18/19  Yes Corwin Levins, MD  potassium chloride (KLOR-CON) 10 MEQ tablet Take 1 tablet (10 mEq total) by mouth daily. 12/23/18  Yes Corwin Levins, MD    Physical Exam: Vitals:   01/17/20 1342 01/17/20 1400 01/17/20 1512 01/17/20 2037  BP: (!) 99/42 (!) 108/48 (!) 112/58 121/67  Pulse: (!) 55 (!) 54 (!) 58 63  Resp:  16 18 16   Temp: 98.5 F (36.9 C)  97.9 F (36.6 C) 97.7 F (36.5 C)  TempSrc: Oral  Oral Oral  SpO2: 99% 98% 100% 99%  Weight:      Height:        General: alert and oriented to time, place, and person. Appear in mild distress, affect appropriate Eyes: PERRL, Conjunctiva normal ENT: Oral Mucosa Clear, moist  Neck: no JVD, no Abnormal Mass Or lumps Cardiovascular: S1 and S2 Present, no Murmur, peripheral pulses symmetrical Respiratory: good respiratory effort, Bilateral Air entry equal and Decreased, no signs of accessory muscle use, Clear to Auscultation, no Crackles, no wheezes Abdomen: Bowel Sound present, Soft and mild diffuse tenderness, no hernia Skin: no rashes  Extremities: no Pedal edema, no calf tenderness Neurologic: without any new focal findings Gait not checked due to  patient safety concerns  Data Reviewed: I have personally reviewed and interpreted labs, imaging as discussed below.  CBC: Recent Labs  Lab 01/16/20 2300  WBC 11.7*  NEUTROABS 7.6  HGB 11.2*  HCT 33.8*  MCV 90.6  PLT 290   Basic Metabolic Panel: Recent Labs  Lab 01/16/20 2300 01/17/20 1145  NA 138 139  K 2.7* 3.3*  CL 102 102  CO2 25 28  GLUCOSE 97 94  BUN 14 20  CREATININE 1.57* 1.13*  CALCIUM 7.2* 7.8*  MG  --  1.6*   GFR: Estimated Creatinine Clearance: 35.6 mL/min (A) (by C-G formula based on SCr of 1.13 mg/dL (H)). Liver Function Tests: Recent Labs  Lab 01/16/20 2300  AST 14*  ALT 12  ALKPHOS 39  BILITOT 0.9  PROT 5.5*  ALBUMIN 2.8*   Recent Labs  Lab  01/16/20 2300  LIPASE 23   No results for input(s): AMMONIA in the last 168 hours. Coagulation Profile: No results for input(s): INR, PROTIME in the last 168 hours. Cardiac Enzymes: No results for input(s): CKTOTAL, CKMB, CKMBINDEX, TROPONINI in the last 168 hours. BNP (last 3 results) No results for input(s): PROBNP in the last 8760 hours. HbA1C: No results for input(s): HGBA1C in the last 72 hours. CBG: No results for input(s): GLUCAP in the last 168 hours. Lipid Profile: No results for input(s): CHOL, HDL, LDLCALC, TRIG, CHOLHDL, LDLDIRECT in the last 72 hours. Thyroid Function Tests: No results for input(s): TSH, T4TOTAL, FREET4, T3FREE, THYROIDAB in the last 72 hours. Anemia Panel: No results for input(s): VITAMINB12, FOLATE, FERRITIN, TIBC, IRON, RETICCTPCT in the last 72 hours. Urine analysis:    Component Value Date/Time   COLORURINE DARK YELLOW 03/18/2019 1639   APPEARANCEUR TURBID (A) 03/18/2019 1639   LABSPEC 1.021 03/18/2019 1639   PHURINE 5.5 03/18/2019 1639   GLUCOSEU NEGATIVE 03/18/2019 1639   GLUCOSEU NEGATIVE 07/02/2018 1630   HGBUR NEGATIVE 03/18/2019 1639   BILIRUBINUR NEGATIVE 07/02/2018 1630   KETONESUR TRACE (A) 03/18/2019 1639   PROTEINUR NEGATIVE 03/18/2019 1639    UROBILINOGEN 0.2 07/02/2018 1630   NITRITE NEGATIVE 03/18/2019 1639   LEUKOCYTESUR NEGATIVE 03/18/2019 1639    Radiological Exams on Admission: CT ABDOMEN PELVIS WO CONTRAST  Result Date: 01/17/2020 CLINICAL DATA:  Left lower quadrant abdominal pain EXAM: CT ABDOMEN AND PELVIS WITHOUT CONTRAST TECHNIQUE: Multidetector CT imaging of the abdomen and pelvis was performed following the standard protocol without IV contrast. COMPARISON:  None. FINDINGS: Lower chest: The visualized heart size within normal limits. No pericardial fluid/thickening. No hiatal hernia. The visualized portions of the lungs are clear. Hepatobiliary: There is diffuse low density seen throughout the liver parenchyma. No evidence of calcified gallstones or biliary ductal dilatation. Pancreas:  Unremarkable.  No surrounding inflammatory changes. Spleen: Normal in size. Although limited due to the lack of intravenous contrast, normal in appearance. Adrenals/Urinary Tract: Both adrenal glands appear normal. The kidneys and collecting system appear normal without evidence of urinary tract calculus or hydronephrosis. Bladder is unremarkable. Stomach/Bowel: The stomach, small bowel, are grossly unremarkable. There is a focal segment of descending colon measuring 9 cm in length with diffuse wall thickening scattered colonic diverticula significant surrounding fat stranding changes. No free air or loculated fluid collections however are noted. Stranding changes extend into the left pericolic gutter. Vascular/Lymphatic: There are no enlarged abdominal or pelvic lymph nodes. Scattered aortic atherosclerotic calcifications are seen without aneurysmal dilatation. Reproductive: The uterus and adnexa are unremarkable. Other: A small amount of free fluid is seen within the deep pelvis. Musculoskeletal: No acute or significant osseous findings. Bilateral total hip arthroplasties are noted. Chronic bilateral pars defects are seen at L5 with a grade 1  anterolisthesis. Cement fixation noted within T12 and L1 vertebral bodies. IMPRESSION: Findings consistent with acute descending colon diverticulitis with significant surrounding inflammatory changes. No loculated fluid collection or free air. Hepatic steatosis Aortic Atherosclerosis (ICD10-I70.0). Electronically Signed   By: Jonna ClarkBindu  Avutu M.D.   On: 01/17/2020 00:18   I reviewed all nursing notes, pharmacy notes, vitals, pertinent old records.  Assessment/Plan 1.  Diverticulitis without any perforation Descending colon affected. Will treat her with IV Unasyn. Continue to monitor. Treated with IV LR. Will provide clear liquid diet. Pain control with fentanyl given her allergies. Monitor. Given her diarrhea check C. difficile  2.  Hypokalemia and hypomagnesemia Correcting. Monitor.  3.  Essential  hypertension  patient is on diuretic at home as well as ARB.  Currently on hold.   4.  COPD Does not appear to be having any exacerbation  continue inhalers.  5.  Depression Continue Celexa, gabapentin  6.  GERD. Continue PPI.  Nutrition: Clear liquid diet DVT Prophylaxis: Subcutaneous Heparin   Advance goals of care discussion: Limited code   Consults: none   Family Communication: family was present at bedside, at the time of interview.  Opportunity was given to ask question and all questions were answered satisfactorily.   Disposition:  From: Home Likely will need Home on discharge.   Author: Lynden Oxford, MD [facility] 01/17/2020 8:44 PM   To reach On-call, see care teams to locate the attending and reach out to them via [email or web address]. If 7PM-7AM, please contact night-coverage If you still have difficulty reaching the attending provider, please page the [facility] (Director on Call) for [facility] on amion for assistance.

## 2020-01-18 DIAGNOSIS — K5792 Diverticulitis of intestine, part unspecified, without perforation or abscess without bleeding: Secondary | ICD-10-CM | POA: Diagnosis not present

## 2020-01-18 LAB — COMPREHENSIVE METABOLIC PANEL
ALT: 12 U/L (ref 0–44)
AST: 15 U/L (ref 15–41)
Albumin: 2.7 g/dL — ABNORMAL LOW (ref 3.5–5.0)
Alkaline Phosphatase: 36 U/L — ABNORMAL LOW (ref 38–126)
Anion gap: 9 (ref 5–15)
BUN: 15 mg/dL (ref 8–23)
CO2: 26 mmol/L (ref 22–32)
Calcium: 7.7 mg/dL — ABNORMAL LOW (ref 8.9–10.3)
Chloride: 107 mmol/L (ref 98–111)
Creatinine, Ser: 0.92 mg/dL (ref 0.44–1.00)
GFR, Estimated: >60 mL/min (ref >60)
Glucose, Bld: 89 mg/dL (ref 70–99)
Potassium: 3.5 mmol/L (ref 3.5–5.1)
Sodium: 142 mmol/L (ref 135–145)
Total Bilirubin: 0.8 mg/dL (ref 0.3–1.2)
Total Protein: 5.5 g/dL — ABNORMAL LOW (ref 6.5–8.1)

## 2020-01-18 LAB — CBC
HCT: 32.6 % — ABNORMAL LOW (ref 36.0–46.0)
Hemoglobin: 10.6 g/dL — ABNORMAL LOW (ref 12.0–15.0)
MCH: 30.4 pg (ref 26.0–34.0)
MCHC: 32.5 g/dL (ref 30.0–36.0)
MCV: 93.4 fL (ref 80.0–100.0)
Platelets: 297 10*3/uL (ref 150–400)
RBC: 3.49 MIL/uL — ABNORMAL LOW (ref 3.87–5.11)
RDW: 13.5 % (ref 11.5–15.5)
WBC: 6.8 10*3/uL (ref 4.0–10.5)
nRBC: 0 % (ref 0.0–0.2)

## 2020-01-18 LAB — C DIFFICILE QUICK SCREEN W PCR REFLEX
C Diff antigen: NEGATIVE
C Diff interpretation: NOT DETECTED
C Diff toxin: NEGATIVE

## 2020-01-18 MED ORDER — INFLUENZA VAC A&B SA ADJ QUAD 0.5 ML IM PRSY
0.5000 mL | PREFILLED_SYRINGE | INTRAMUSCULAR | Status: AC
  Administered 2020-01-19: 14:00:00 0.5 mL via INTRAMUSCULAR
  Filled 2020-01-18: qty 0.5

## 2020-01-18 MED ORDER — DM-GUAIFENESIN ER 30-600 MG PO TB12
1.0000 | ORAL_TABLET | Freq: Two times a day (BID) | ORAL | Status: AC
  Administered 2020-01-18 – 2020-01-19 (×4): 1 via ORAL
  Filled 2020-01-18 (×2): qty 1
  Filled 2020-01-18: qty 2
  Filled 2020-01-18: qty 1

## 2020-01-18 MED ORDER — ALBUTEROL SULFATE (2.5 MG/3ML) 0.083% IN NEBU: 2.5000 mg | INHALATION_SOLUTION | Freq: Four times a day (QID) | RESPIRATORY_TRACT | Status: DC | PRN

## 2020-01-18 MED ORDER — TIOTROPIUM BROMIDE MONOHYDRATE 18 MCG IN CAPS
18.0000 ug | ORAL_CAPSULE | Freq: Every day | RESPIRATORY_TRACT | Status: DC
  Filled 2020-01-18: qty 5

## 2020-01-18 MED ORDER — UMECLIDINIUM BROMIDE 62.5 MCG/INH IN AEPB
1.0000 | INHALATION_SPRAY | Freq: Every day | RESPIRATORY_TRACT | Status: DC
  Administered 2020-01-18 – 2020-01-21 (×4): 1 via RESPIRATORY_TRACT
  Filled 2020-01-18: qty 7

## 2020-01-18 MED ORDER — AMOXICILLIN-POT CLAVULANATE 875-125 MG PO TABS
1.0000 | ORAL_TABLET | Freq: Two times a day (BID) | ORAL | Status: DC
  Administered 2020-01-18 – 2020-01-19 (×4): 1 via ORAL
  Filled 2020-01-18 (×4): qty 1

## 2020-01-18 MED ORDER — ALBUTEROL SULFATE (2.5 MG/3ML) 0.083% IN NEBU
2.5000 mg | INHALATION_SOLUTION | RESPIRATORY_TRACT | Status: DC
  Administered 2020-01-18: 19:00:00 2.5 mg via RESPIRATORY_TRACT
  Filled 2020-01-18: qty 3

## 2020-01-18 NOTE — Progress Notes (Signed)
PROGRESS NOTE   Desiree [name]  WLN:[id number] DOB: [birth year] DOA: 01/16/2020 PCP: Corwin Levins, MD  Brief Narrative:  84 year old white female Gold stage II-III COPD HTN, HLD Recent treatment for bronchitis several rounds of antibiotics Levaquin and Omnicef Immunized against coronavirus + booster   Comes to ED with 1 month history of nausea vomiting in the setting of recent treatment with multiple rounds of antibiotics for bronchitis She was also hypocalcemic hypokalemic diverticulitis  Assessment & Plan:   Active Problems:   Diverticulitis   1. Diverticulitis a. Continue IV Unasyn and narrow in the next day or so b. Abdominal exam is relatively benign pain is improved in left lower quadrant compared to prior and she is able to tolerate some diet c. No diarrhea today 2. Electrolyte abnormalities a. Hypokalemia replaced with K. Dur 40x2 in addition to magnesium 2 g on admission b.  hypocalcemia-corrected calcium is 8.74 3. COPD Gold stage II-III a. Unlikely to have an infection-Unasyn will cover respiratory pathogens for exacerbation b. On albuterol Singulair and Dulera-adding Advair to see if this helps may need to increase frequency of nebs c. Will need outpatient pulmonology follow-up and may require CT scan of chest if not better to rule out other etiologies 4. Depression a. Continue Celexa 20 which has been reordered 5. Reflux a. Continue Protonix 40 daily 6. HTN a. Losartan 100 held given infection b. Monitor trends   DVT prophylaxis: Lovenox Code Status: Partial code Family Communication: Discussed with daughter at bedside Disposition:  Status is: Inpatient  Remains inpatient appropriate because:Hemodynamically unstable, Ongoing active pain requiring inpatient pain management and Altered mental status   Dispo: The patient is from: Home              Anticipated d/c is to: SNF              Anticipated d/c date is: 1 day              Patient currently is not  medically stable to d/c.       Consultants:   None yet  Procedures: None  Antimicrobials: Unasyn   Subjective: Coughing incessantly at bedside but was able to eat something earlier today no significant abdominal pain No chest pain No fever Bringing up mild sputum   Objective: Vitals:   01/17/20 2037 01/18/20 0526 01/18/20 0807 01/18/20 1431  BP: 121/67 (!) 108/57  118/72  Pulse: 63 65  70  Resp: 16 16  15   Temp: 97.7 F (36.5 C) 97.9 F (36.6 C)  97.9 F (36.6 C)  TempSrc: Oral Oral  Oral  SpO2: 99% 95%  99%  Weight:   71.2 kg   Height:        Intake/Output Summary (Last 24 hours) at 01/18/2020 1503 Last data filed at 01/18/2020 1006 Gross per 24 hour  Intake 540 ml  Output 200 ml  Net 340 ml   Filed Weights   01/16/20 1520 01/18/20 0807  Weight: 68.5 kg 71.2 kg    Examination:  EOMI NCAT no focal deficit Increased lung sounds bilaterally posteriorly with rales Abdomen soft but tender in lower quadrant although feels better than yesterday per daughter No lower extremity edema S1-S2 no murmur no rub no gallop Neurologically intact moving all 4 limbs without deficit  Data Reviewed: I have personally reviewed following labs and imaging studies  Sodium 142 Calcium 7.7 Potassium 3.3-->3.5 WBC 6.8 Platelet 297   Radiology Studies: CT ABDOMEN PELVIS WO CONTRAST  Result Date: 01/17/2020 CLINICAL  DATA:  Left lower quadrant abdominal pain EXAM: CT ABDOMEN AND PELVIS WITHOUT CONTRAST TECHNIQUE: Multidetector CT imaging of the abdomen and pelvis was performed following the standard protocol without IV contrast. COMPARISON:  None. FINDINGS: Lower chest: The visualized heart size within normal limits. No pericardial fluid/thickening. No hiatal hernia. The visualized portions of the lungs are clear. Hepatobiliary: There is diffuse low density seen throughout the liver parenchyma. No evidence of calcified gallstones or biliary ductal dilatation. Pancreas:   Unremarkable.  No surrounding inflammatory changes. Spleen: Normal in size. Although limited due to the lack of intravenous contrast, normal in appearance. Adrenals/Urinary Tract: Both adrenal glands appear normal. The kidneys and collecting system appear normal without evidence of urinary tract calculus or hydronephrosis. Bladder is unremarkable. Stomach/Bowel: The stomach, small bowel, are grossly unremarkable. There is a focal segment of descending colon measuring 9 cm in length with diffuse wall thickening scattered colonic diverticula significant surrounding fat stranding changes. No free air or loculated fluid collections however are noted. Stranding changes extend into the left pericolic gutter. Vascular/Lymphatic: There are no enlarged abdominal or pelvic lymph nodes. Scattered aortic atherosclerotic calcifications are seen without aneurysmal dilatation. Reproductive: The uterus and adnexa are unremarkable. Other: A small amount of free fluid is seen within the deep pelvis. Musculoskeletal: No acute or significant osseous findings. Bilateral total hip arthroplasties are noted. Chronic bilateral pars defects are seen at L5 with a grade 1 anterolisthesis. Cement fixation noted within T12 and L1 vertebral bodies. IMPRESSION: Findings consistent with acute descending colon diverticulitis with significant surrounding inflammatory changes. No loculated fluid collection or free air. Hepatic steatosis Aortic Atherosclerosis (ICD10-I70.0). Electronically Signed   By: Jonna Clark M.D.   On: 01/17/2020 00:18     Scheduled Meds: . amoxicillin-clavulanate  1 tablet Oral Q12H  . citalopram  20 mg Oral Daily  . dextromethorphan-guaiFENesin  1 tablet Oral BID  . enoxaparin (LOVENOX) injection  40 mg Subcutaneous Q24H  . feeding supplement  237 mL Oral BID BM  . gabapentin  300 mg Oral QHS  . [START ON 01/19/2020] iron polysaccharides  150 mg Oral Once per day on Mon Thu  . melatonin  10 mg Oral QHS  .  mometasone-formoterol  2 puff Inhalation BID  . montelukast  10 mg Oral QHS  . pantoprazole  40 mg Oral Daily  . saccharomyces boulardii  250 mg Oral BID  . sodium chloride flush  3 mL Intravenous Q12H   Continuous Infusions: . sodium chloride 150 mL/hr at 01/17/20 0054  . lactated ringers 75 mL/hr at 01/17/20 1706     LOS: 1 day    Time spent: 93  Rhetta Mura, MD [facility] To contact the attending provider between 7A-7P or the covering provider during after hours 7P-7A, please log into the web site [email or web address] and access using universal [employer] password for that web site. If you do not have the password, please call the hospital operator.  01/18/2020, 3:03 PM

## 2020-01-18 NOTE — Progress Notes (Signed)
Patient advanced from clear liquid diet to soft diet today. Patient had meal tray around 1300. No complaints of pain. Around 1500 patient had complaints of nausea and vomiting, Zofran was adminstered. No complaints of pain. Patient states she believe she ate too fast and that's what made her nauseous.  Zofran was effective in treating patients complaints. No further complaints. Will continue to moniotr.

## 2020-01-19 ENCOUNTER — Inpatient Hospital Stay (HOSPITAL_COMMUNITY): Payer: Medicare Other

## 2020-01-19 DIAGNOSIS — K5792 Diverticulitis of intestine, part unspecified, without perforation or abscess without bleeding: Secondary | ICD-10-CM | POA: Diagnosis not present

## 2020-01-19 DIAGNOSIS — I5030 Unspecified diastolic (congestive) heart failure: Secondary | ICD-10-CM | POA: Diagnosis not present

## 2020-01-19 LAB — CBC WITH DIFFERENTIAL/PLATELET
Abs Immature Granulocytes: 0.03 10*3/uL (ref 0.00–0.07)
Basophils Absolute: 0 10*3/uL (ref 0.0–0.1)
Basophils Relative: 0 %
Eosinophils Absolute: 0.2 10*3/uL (ref 0.0–0.5)
Eosinophils Relative: 4 %
HCT: 27.5 % — ABNORMAL LOW (ref 36.0–46.0)
Hemoglobin: 8.8 g/dL — ABNORMAL LOW (ref 12.0–15.0)
Immature Granulocytes: 1 %
Lymphocytes Relative: 27 %
Lymphs Abs: 1.2 10*3/uL (ref 0.7–4.0)
MCH: 29.7 pg (ref 26.0–34.0)
MCHC: 32 g/dL (ref 30.0–36.0)
MCV: 92.9 fL (ref 80.0–100.0)
Monocytes Absolute: 0.5 10*3/uL (ref 0.1–1.0)
Monocytes Relative: 12 %
Neutro Abs: 2.6 10*3/uL (ref 1.7–7.7)
Neutrophils Relative %: 56 %
Platelets: 262 10*3/uL (ref 150–400)
RBC: 2.96 MIL/uL — ABNORMAL LOW (ref 3.87–5.11)
RDW: 13.4 % (ref 11.5–15.5)
WBC: 4.5 10*3/uL (ref 4.0–10.5)
nRBC: 0 % (ref 0.0–0.2)

## 2020-01-19 LAB — COMPREHENSIVE METABOLIC PANEL
ALT: 12 U/L (ref 0–44)
AST: 13 U/L — ABNORMAL LOW (ref 15–41)
Albumin: 2.1 g/dL — ABNORMAL LOW (ref 3.5–5.0)
Alkaline Phosphatase: 29 U/L — ABNORMAL LOW (ref 38–126)
Anion gap: 11 (ref 5–15)
BUN: 9 mg/dL (ref 8–23)
CO2: 21 mmol/L — ABNORMAL LOW (ref 22–32)
Calcium: 7.3 mg/dL — ABNORMAL LOW (ref 8.9–10.3)
Chloride: 107 mmol/L (ref 98–111)
Creatinine, Ser: 0.73 mg/dL (ref 0.44–1.00)
GFR, Estimated: >60 mL/min (ref >60)
Glucose, Bld: 85 mg/dL (ref 70–99)
Potassium: 3.2 mmol/L — ABNORMAL LOW (ref 3.5–5.1)
Sodium: 139 mmol/L (ref 135–145)
Total Bilirubin: 0.3 mg/dL (ref 0.3–1.2)
Total Protein: 4.4 g/dL — ABNORMAL LOW (ref 6.5–8.1)

## 2020-01-19 LAB — ECHOCARDIOGRAM COMPLETE
Area-P 1/2: 3.42 cm2
Height: 63 in
S' Lateral: 2.8 cm
Weight: 2511.48 oz

## 2020-01-19 LAB — BRAIN NATRIURETIC PEPTIDE: B Natriuretic Peptide: 403.9 pg/mL — ABNORMAL HIGH (ref 0.0–100.0)

## 2020-01-19 MED ORDER — CALCIUM CARBONATE ANTACID 500 MG PO CHEW
400.0000 mg | CHEWABLE_TABLET | Freq: Two times a day (BID) | ORAL | Status: DC
  Administered 2020-01-19 – 2020-01-21 (×5): 400 mg via ORAL
  Filled 2020-01-19 (×5): qty 2

## 2020-01-19 MED ORDER — FUROSEMIDE 10 MG/ML IJ SOLN
20.0000 mg | Freq: Once | INTRAMUSCULAR | Status: AC
  Administered 2020-01-19: 11:00:00 20 mg via INTRAVENOUS
  Filled 2020-01-19: qty 2

## 2020-01-19 MED ORDER — POTASSIUM CHLORIDE CRYS ER 20 MEQ PO TBCR
40.0000 meq | EXTENDED_RELEASE_TABLET | Freq: Two times a day (BID) | ORAL | Status: DC
  Administered 2020-01-19 – 2020-01-21 (×5): 40 meq via ORAL
  Filled 2020-01-19 (×5): qty 2

## 2020-01-19 NOTE — Progress Notes (Signed)
  Echocardiogram 2D Echocardiogram has been performed.  Desiree [name] 01/19/2020, 1:37 PM

## 2020-01-19 NOTE — Progress Notes (Signed)
PROGRESS NOTE   Desiree [name]  OJJ:[id number] DOB: November 07, 1935 DOA: 01/16/2020 PCP: Corwin Levins, MD  Brief Narrative:  84 year old white female Gold stage II-III COPD HTN, HLD Recent treatment for bronchitis several rounds of antibiotics Levaquin and Omnicef Immunized against coronavirus + booster   Comes to ED with 1 month history of nausea vomiting in the setting of recent treatment with multiple rounds of antibiotics for bronchitis She was also hypocalcemic hypokalemic diverticulitis  It was felt that some of her bronchitis issues may also have another etiology so further work-up was performed  Assessment & Plan:   Active Problems:   Diverticulitis   1. Diverticulitis a. Narrowed from IV Unasyn to Augmentin b. Some nausea yesterday but otherwise is improved eating and drinking better no diarrhea c. If stabilizes over the next day probably can discharge a.m. 12/31 2. ?  Diastolic heart failure a. Tells me over the past several months has had some swelling of her feet tightness of her rings b. BNP is elevated at 402 possibility of mild heart failure given her hypertension c. Starting Lasix 20 daily and monitor for effect with regards to cough 3. COPD Gold stage II-III a. Unlikely to have an infection-Unasyn will cover respiratory pathogens for exacerbation b. On albuterol Singulair and Dulera-Advair was added and she seems slightly improved c. Will need outpatient pulmonology follow-up-consider outpatient CT scan etc. 4. Electrolyte abnormalities a. Hypokalemia -give K. Dur 40 twice daily today b.  hypocalcemia-corrected calcium is slightly low we will start calcium carbonate 5. Depression a. Continue Celexa 20 which has been reordered 6. Reflux a. Continue Protonix 40 daily 7. HTN a. Losartan 100 held given infection b. Monitor trends   DVT prophylaxis: Lovenox Code Status: Partial code Family Communication:  no family was present today Disposition:  Status is:  Inpatient  Remains inpatient appropriate because:Hemodynamically unstable, Ongoing active pain requiring inpatient pain management and Altered mental status   Dispo: The patient is from: Home              Anticipated d/c is to: SNF              Anticipated d/c date is: 1 day              Patient currently is not medically stable to d/c.  Consultants:   None yet  Procedures: None  Antimicrobials: Unasyn   Subjective:  Doing better no diarrhea fever chills abdominal pain is improved-states had copious diarrhea yesterday Tolerating some diet Seems slightly tired   Objective: Vitals:   01/18/20 1928 01/18/20 2124 01/19/20 0514 01/19/20 0839  BP:  (!) 122/96 117/82   Pulse:  82 74   Resp:  16 16   Temp:  97.9 F (36.6 C) 98.2 F (36.8 C)   TempSrc:  Oral Oral   SpO2: 98% 96% 99% 95%  Weight:      Height:        Intake/Output Summary (Last 24 hours) at 01/19/2020 1208 Last data filed at 01/19/2020 1154 Gross per 24 hour  Intake 1420 ml  Output 750 ml  Net 670 ml   Filed Weights   01/16/20 1520 01/18/20 0807  Weight: 68.5 kg 71.2 kg    Examination:  EOMI NCAT no focal deficit looks about stated age Rales are diminished posterolaterally in lung fields Abdomen soft less tenderness in lower quadrant No lower extremity edema S1-S2 no murmur no rub no gallop Neurologically intact moving all 4 limbs without deficit  Data Reviewed: I  have personally reviewed following labs and imaging studies  Sodium 142--139 Potassium 3.3-->3.5--3.2 WBC 6.8 Platelet 297   Radiology Studies: No results found.   Scheduled Meds: . amoxicillin-clavulanate  1 tablet Oral Q12H  . calcium carbonate  400 mg of elemental calcium Oral BID  . citalopram  20 mg Oral Daily  . enoxaparin (LOVENOX) injection  40 mg Subcutaneous Q24H  . feeding supplement  237 mL Oral BID BM  . gabapentin  300 mg Oral QHS  . influenza vaccine adjuvanted  0.5 mL Intramuscular Tomorrow-1000  . iron  polysaccharides  150 mg Oral Once per day on Mon Thu  . melatonin  10 mg Oral QHS  . mometasone-formoterol  2 puff Inhalation BID  . montelukast  10 mg Oral QHS  . pantoprazole  40 mg Oral Daily  . potassium chloride  40 mEq Oral BID  . saccharomyces boulardii  250 mg Oral BID  . sodium chloride flush  3 mL Intravenous Q12H  . umeclidinium bromide  1 puff Inhalation Daily   Continuous Infusions:    LOS: 2 days    Time spent: 89  Rhetta Mura, MD [facility] To contact the attending provider between 7A-7P or the covering provider during after hours 7P-7A, please log into the web site [email or web address] and access using universal [place] password for that web site. If you do not have the password, please call the hospital operator.  01/19/2020, 12:08 PM

## 2020-01-20 ENCOUNTER — Inpatient Hospital Stay (HOSPITAL_COMMUNITY): Payer: Medicare Other

## 2020-01-20 DIAGNOSIS — K5792 Diverticulitis of intestine, part unspecified, without perforation or abscess without bleeding: Secondary | ICD-10-CM | POA: Diagnosis not present

## 2020-01-20 LAB — COMPREHENSIVE METABOLIC PANEL
ALT: 15 U/L (ref 0–44)
AST: 16 U/L (ref 15–41)
Albumin: 2.8 g/dL — ABNORMAL LOW (ref 3.5–5.0)
Alkaline Phosphatase: 38 U/L (ref 38–126)
Anion gap: 10 (ref 5–15)
BUN: 9 mg/dL (ref 8–23)
CO2: 23 mmol/L (ref 22–32)
Calcium: 7.7 mg/dL — ABNORMAL LOW (ref 8.9–10.3)
Chloride: 110 mmol/L (ref 98–111)
Creatinine, Ser: 0.89 mg/dL (ref 0.44–1.00)
GFR, Estimated: >60 mL/min (ref >60)
Glucose, Bld: 101 mg/dL — ABNORMAL HIGH (ref 70–99)
Potassium: 3.6 mmol/L (ref 3.5–5.1)
Sodium: 143 mmol/L (ref 135–145)
Total Bilirubin: 0.3 mg/dL (ref 0.3–1.2)
Total Protein: 5.6 g/dL — ABNORMAL LOW (ref 6.5–8.1)

## 2020-01-20 LAB — CBC WITH DIFFERENTIAL/PLATELET
Abs Immature Granulocytes: 0.07 10*3/uL (ref 0.00–0.07)
Basophils Absolute: 0 10*3/uL (ref 0.0–0.1)
Basophils Relative: 1 %
Eosinophils Absolute: 0.3 10*3/uL (ref 0.0–0.5)
Eosinophils Relative: 5 %
HCT: 33.2 % — ABNORMAL LOW (ref 36.0–46.0)
Hemoglobin: 10.8 g/dL — ABNORMAL LOW (ref 12.0–15.0)
Immature Granulocytes: 1 %
Lymphocytes Relative: 29 %
Lymphs Abs: 1.6 10*3/uL (ref 0.7–4.0)
MCH: 30.2 pg (ref 26.0–34.0)
MCHC: 32.5 g/dL (ref 30.0–36.0)
MCV: 92.7 fL (ref 80.0–100.0)
Monocytes Absolute: 0.7 10*3/uL (ref 0.1–1.0)
Monocytes Relative: 13 %
Neutro Abs: 2.8 10*3/uL (ref 1.7–7.7)
Neutrophils Relative %: 51 %
Platelets: 323 10*3/uL (ref 150–400)
RBC: 3.58 MIL/uL — ABNORMAL LOW (ref 3.87–5.11)
RDW: 13.5 % (ref 11.5–15.5)
WBC: 5.5 10*3/uL (ref 4.0–10.5)
nRBC: 0 % (ref 0.0–0.2)

## 2020-01-20 MED ORDER — METRONIDAZOLE 500 MG PO TABS
500.0000 mg | ORAL_TABLET | Freq: Three times a day (TID) | ORAL | Status: DC
Start: 2020-01-20 — End: 2020-01-21
  Administered 2020-01-20 – 2020-01-21 (×4): 500 mg via ORAL
  Filled 2020-01-20 (×4): qty 1

## 2020-01-20 MED ORDER — WITCH HAZEL-GLYCERIN EX PADS
MEDICATED_PAD | CUTANEOUS | Status: DC | PRN
  Filled 2020-01-20: qty 100

## 2020-01-20 MED ORDER — HYDROCOD POLST-CPM POLST ER 10-8 MG/5ML PO SUER
5.0000 mL | Freq: Two times a day (BID) | ORAL | Status: DC
  Administered 2020-01-20 – 2020-01-21 (×3): 5 mL via ORAL
  Filled 2020-01-20 (×3): qty 5

## 2020-01-20 MED ORDER — CEFDINIR 300 MG PO CAPS
300.0000 mg | ORAL_CAPSULE | Freq: Two times a day (BID) | ORAL | Status: DC
Start: 2020-01-20 — End: 2020-01-21
  Administered 2020-01-20 – 2020-01-21 (×3): 300 mg via ORAL
  Filled 2020-01-20 (×3): qty 1

## 2020-01-20 NOTE — Progress Notes (Addendum)
PROGRESS NOTE   Desiree [name]  ZOX:[id number] DOB: 1935-02-26 DOA: 01/16/2020 PCP: Corwin Levins, MD  Brief Narrative:  84 year old white female Desiree [name] Gold stage II-III COPD HTN, HLD Recent treatment for bronchitis several rounds of antibiotics Levaquin and Omnicef Immunized against coronavirus + booster   Comes to ED with 1 month history of nausea vomiting in the setting of recent treatment with multiple rounds of antibiotics for bronchitis She was also hypocalcemic hypokalemic diverticulitis  It was felt that some of her bronchitis issues may also have another etiology so further work-up was performed  Assessment & Plan:   Active Problems:   Diverticulitis  1. Diverticulitis a. Narrowed from IV Unasyn --Diarrhea x3 today changing to cefdinir 300 q12/Flagyll 500 tid 2. ?  Cough-ruled out Diastolic heart failure a. Tells me over the past several months has had some swelling of her feet tightness of her rings b. BNP is elevated at 402 echo is negative c. Discontinue Lasix 3. COPD Gold stage II-III-emphysema a. Unlikely to have an infection b. On albuterol Singulair and Dulera-Advair was added and she seems slightly improved c. Repeat chest x-ray 12/31 2 view shows only emphysema 4. Electrolyte abnormalities a. Hypokalemia -give K. Dur 40 twice daily today b. hypocalcemia-corrected calcium is slightly low we will start calcium carbonate 5. Depression a. Continue Celexa 20 which has been reordered 6. Reflux a. Continue Protonix 40 daily 7. HTN a. Losartan 100 held given infection b. Monitor trends   DVT prophylaxis: Lovenox Code Status: Partial code Family Communication:  Jayme Cloud [phone] and updated fully Disposition:  Status is: Inpatient  Remains inpatient appropriate because:Hemodynamically unstable, Ongoing active pain requiring inpatient pain management and Altered mental status   Dispo: The patient is from: Home              Anticipated d/c is  to: ALF--[name]              Anticipated d/c date is: 1 day              Patient currently is not medically stable to d/c.  Consultants:   None yet  Procedures: None  Antimicrobials: Unasyn-->Cefdinir  Subjective:  Continued cough Some confusion per daughter No cp Continues to cough without much sputum  Objective: Vitals:   01/19/20 2014 01/20/20 0458 01/20/20 0838 01/20/20 1723  BP:  (!) 124/41  91/78  Pulse:  63  72  Resp:  20  18  Temp:  97.8 F (36.6 C)  97.6 F (36.4 C)  TempSrc:  Oral  Oral  SpO2: 98% 96% 97% 95%  Weight:      Height:       No intake or output data in the 24 hours ending 01/20/20 1755 Filed Weights   01/16/20 1520 01/18/20 0807  Weight: 68.5 kg 71.2 kg    Examination:  EOMI NCAT  Rales are diminished posterolaterally in lung fields Abdomen soft less tenderness in lower quadrant No lower extremity edema S1-S2 no murmur no rub no gallop Neurologically intact moving all 4 limbs without deficit  Data Reviewed: I have personally reviewed following labs and imaging studies  Sodium 142--139--143 Potassium 3.3-->3.5--3.2-->3.6 WBC 6.8-->6.8 Platelet 297   Radiology Studies: DG Chest 2 View  Result Date: 01/20/2020 CLINICAL DATA:  One month history of nausea and vomiting, history of bronchitis and asthma EXAM: CHEST - 2 VIEW COMPARISON:  12/22/2019 FINDINGS: Frontal and lateral views of the chest demonstrate an unremarkable cardiac silhouette. The lungs are hyperinflated, with  chronic interstitial scarring. Trace right pleural effusion. No airspace disease or pneumothorax. No acute bony abnormalities. Stable vertebral augmentation lower thoracic spine. IMPRESSION: 1. Stable emphysema. 2. Trace right pleural effusion. Electronically Signed   By: Sharlet Salina M.D.   On: 01/20/2020 16:43   ECHOCARDIOGRAM COMPLETE  Result Date: 01/19/2020    ECHOCARDIOGRAM REPORT   Patient Name:   Desiree [name] Date of Exam: 01/19/2020 Medical Rec  #:  [id number]      Height:       63.0 in Accession #:    [id number]     Weight:       157.0 lb Date of Birth:  [birth year]       BSA:          1.744 m Patient Age:    84 years       BP:           117/82 mmHg Patient Gender: F              HR:           74 bpm. Exam Location:  Inpatient Procedure: 2D Echo, Cardiac Doppler and Color Doppler Indications:    CHF  History:        Patient has no prior history of Echocardiogram examinations.                 COPD, Signs/Symptoms:Dyspnea; Risk Factors:Hypertension and                 Dyslipidemia. Bronchitis, COPD Gold.  Sonographer:    Lavenia Atlas Referring Phys: [phone] [facility]  Sonographer Comments: Image acquisition challenging due to COPD. IMPRESSIONS  1. Left ventricular ejection fraction, by estimation, is 60 to 65%. The left ventricle has normal function. The left ventricle has no regional wall motion abnormalities. Left ventricular diastolic parameters were normal.  2. Right ventricular systolic function is normal. The right ventricular size is normal. There is normal pulmonary artery systolic pressure.  3. The mitral valve is normal in structure. Mild mitral valve regurgitation. No evidence of mitral stenosis.  4. The aortic valve is tricuspid. Aortic valve regurgitation is not visualized. Mild to moderate aortic valve sclerosis/calcification is present, without any evidence of aortic stenosis.  5. The inferior vena cava is normal in size with greater than 50% respiratory variability, suggesting right atrial pressure of 3 mmHg. FINDINGS  Left Ventricle: Left ventricular ejection fraction, by estimation, is 60 to 65%. The left ventricle has normal function. The left ventricle has no regional wall motion abnormalities. The left ventricular internal cavity size was normal in size. There is  no left ventricular hypertrophy. Left ventricular diastolic parameters were normal. Right Ventricle: The right ventricular size is normal. No increase in right  ventricular wall thickness. Right ventricular systolic function is normal. There is normal pulmonary artery systolic pressure. The tricuspid regurgitant velocity is 2.42 m/s, and  with an assumed right atrial pressure of 3 mmHg, the estimated right ventricular systolic pressure is 26.4 mmHg. Left Atrium: Left atrial size was normal in size. Right Atrium: Right atrial size was normal in size. Pericardium: There is no evidence of pericardial effusion. Mitral Valve: The mitral valve is normal in structure. Mild mitral valve regurgitation. No evidence of mitral valve stenosis. Tricuspid Valve: The tricuspid valve is normal in structure. Tricuspid valve regurgitation is trivial. No evidence of tricuspid stenosis. Aortic Valve: The aortic valve is tricuspid. Aortic valve regurgitation is not visualized. Mild to moderate aortic valve sclerosis/calcification is present, without  any evidence of aortic stenosis. Pulmonic Valve: The pulmonic valve was normal in structure. Pulmonic valve regurgitation is not visualized. No evidence of pulmonic stenosis. Aorta: The aortic root is normal in size and structure. Venous: The inferior vena cava is normal in size with greater than 50% respiratory variability, suggesting right atrial pressure of 3 mmHg. IAS/Shunts: No atrial level shunt detected by color flow Doppler.  LEFT VENTRICLE PLAX 2D LVIDd:         4.10 cm  Diastology LVIDs:         2.80 cm  LV e' medial:    9.46 cm/s LV PW:         1.00 cm  LV E/e' medial:  10.1 LV IVS:        1.00 cm  LV e' lateral:   10.80 cm/s LVOT diam:     1.90 cm  LV E/e' lateral: 8.8 LV SV:         56 LV SV Index:   32 LVOT Area:     2.84 cm  RIGHT VENTRICLE RV Basal diam:  2.80 cm RV S prime:     9.25 cm/s TAPSE (M-mode): 2.5 cm LEFT ATRIUM             Index       RIGHT ATRIUM           Index LA diam:        2.50 cm 1.43 cm/m  RA Area:     13.90 cm LA Vol (A2C):   28.2 ml 16.17 ml/m RA Volume:   38.50 ml  22.07 ml/m LA Vol (A4C):   25.7 ml 14.73  ml/m LA Biplane Vol: 28.0 ml 16.05 ml/m  AORTIC VALVE LVOT Vmax:   99.80 cm/s LVOT Vmean:  63.000 cm/s LVOT VTI:    0.197 m  AORTA Ao Root diam: 2.60 cm MITRAL VALVE               TRICUSPID VALVE MV Area (PHT): 3.42 cm    TR Peak grad:   23.4 mmHg MV Decel Time: 222 msec    TR Vmax:        242.00 cm/s MV E velocity: 95.50 cm/s MV A velocity: 67.30 cm/s  SHUNTS MV E/A ratio:  1.42        Systemic VTI:  0.20 m                            Systemic Diam: 1.90 cm Donato Schultz MD Electronically signed by Donato Schultz MD Signature Date/Time: 01/19/2020/2:31:57 PM    Final      Scheduled Meds: . calcium carbonate  400 mg of elemental calcium Oral BID  . cefdinir  300 mg Oral Q12H  . chlorpheniramine-HYDROcodone  5 mL Oral Q12H  . citalopram  20 mg Oral Daily  . enoxaparin (LOVENOX) injection  40 mg Subcutaneous Q24H  . feeding supplement  237 mL Oral BID BM  . gabapentin  300 mg Oral QHS  . iron polysaccharides  150 mg Oral Once per day on Mon Thu  . melatonin  10 mg Oral QHS  . metroNIDAZOLE  500 mg Oral TID  . mometasone-formoterol  2 puff Inhalation BID  . montelukast  10 mg Oral QHS  . pantoprazole  40 mg Oral Daily  . potassium chloride  40 mEq Oral BID  . saccharomyces boulardii  250 mg Oral BID  . sodium chloride flush  3 mL  Intravenous Q12H  . umeclidinium bromide  1 puff Inhalation Daily   Continuous Infusions:    LOS: 3 days    Time spent: 5635  Rhetta MuraJai-Gurmukh Leilanni Halvorson, MD [facility] To contact the attending provider between 7A-7P or the covering provider during after hours 7P-7A, please log into the web site [email or web address] and access using universal [place] password for that web site. If you do not have the password, please call the hospital operator.  01/20/2020, 5:55 PM

## 2020-01-21 DIAGNOSIS — K5792 Diverticulitis of intestine, part unspecified, without perforation or abscess without bleeding: Secondary | ICD-10-CM | POA: Diagnosis not present

## 2020-01-21 LAB — COMPREHENSIVE METABOLIC PANEL
ALT: 16 U/L (ref 0–44)
AST: 17 U/L (ref 15–41)
Albumin: 2.8 g/dL — ABNORMAL LOW (ref 3.5–5.0)
Alkaline Phosphatase: 41 U/L (ref 38–126)
Anion gap: 7 (ref 5–15)
BUN: 12 mg/dL (ref 8–23)
CO2: 24 mmol/L (ref 22–32)
Calcium: 8.1 mg/dL — ABNORMAL LOW (ref 8.9–10.3)
Chloride: 110 mmol/L (ref 98–111)
Creatinine, Ser: 0.77 mg/dL (ref 0.44–1.00)
GFR, Estimated: >60 mL/min (ref >60)
Glucose, Bld: 101 mg/dL — ABNORMAL HIGH (ref 70–99)
Potassium: 4 mmol/L (ref 3.5–5.1)
Sodium: 141 mmol/L (ref 135–145)
Total Bilirubin: 0.5 mg/dL (ref 0.3–1.2)
Total Protein: 5.6 g/dL — ABNORMAL LOW (ref 6.5–8.1)

## 2020-01-21 LAB — CBC WITH DIFFERENTIAL/PLATELET
Abs Immature Granulocytes: 0.05 10*3/uL (ref 0.00–0.07)
Basophils Absolute: 0 10*3/uL (ref 0.0–0.1)
Basophils Relative: 1 %
Eosinophils Absolute: 0.2 10*3/uL (ref 0.0–0.5)
Eosinophils Relative: 3 %
HCT: 32.9 % — ABNORMAL LOW (ref 36.0–46.0)
Hemoglobin: 10.6 g/dL — ABNORMAL LOW (ref 12.0–15.0)
Immature Granulocytes: 1 %
Lymphocytes Relative: 27 %
Lymphs Abs: 1.8 10*3/uL (ref 0.7–4.0)
MCH: 30.3 pg (ref 26.0–34.0)
MCHC: 32.2 g/dL (ref 30.0–36.0)
MCV: 94 fL (ref 80.0–100.0)
Monocytes Absolute: 0.7 10*3/uL (ref 0.1–1.0)
Monocytes Relative: 11 %
Neutro Abs: 3.8 10*3/uL (ref 1.7–7.7)
Neutrophils Relative %: 57 %
Platelets: 317 10*3/uL (ref 150–400)
RBC: 3.5 MIL/uL — ABNORMAL LOW (ref 3.87–5.11)
RDW: 13.4 % (ref 11.5–15.5)
WBC: 6.5 10*3/uL (ref 4.0–10.5)
nRBC: 0 % (ref 0.0–0.2)

## 2020-01-21 MED ORDER — UMECLIDINIUM BROMIDE 62.5 MCG/INH IN AEPB: 1.0000 | INHALATION_SPRAY | Freq: Every day | RESPIRATORY_TRACT | 1 refills | Status: DC

## 2020-01-21 MED ORDER — METRONIDAZOLE 500 MG PO TABS: 500.0000 mg | ORAL_TABLET | Freq: Three times a day (TID) | ORAL | 0 refills | Status: AC

## 2020-01-21 MED ORDER — CEFDINIR 300 MG PO CAPS
300.0000 mg | ORAL_CAPSULE | Freq: Two times a day (BID) | ORAL | 0 refills | Status: AC
Start: 2020-01-21 — End: ?

## 2020-01-21 MED ORDER — HYDROCOD POLST-CPM POLST ER 10-8 MG/5ML PO SUER: 5.0000 mL | Freq: Two times a day (BID) | ORAL | 0 refills | Status: AC

## 2020-01-21 MED ORDER — POTASSIUM CHLORIDE CRYS ER 20 MEQ PO TBCR: 40.0000 meq | EXTENDED_RELEASE_TABLET | Freq: Two times a day (BID) | ORAL | 0 refills | Status: AC

## 2020-01-21 NOTE — Discharge Summary (Signed)
Physician Discharge Summary  Desiree SantosLoJuanna [name] JXB:[id number] DOB: 05/24/1935 DOA: 01/16/2020  PCP: Corwin LevinsJohn, James W, MD  Admit date: 01/16/2020 Discharge date: 01/21/2020  Time spent: 40 minutes  Recommendations for Outpatient Follow-up:  1. Complete cefdinir and Flagyl for diverticulitis 2. Get Chem-12 CBC 1 week 3. Outpatient testing for emphysema may be spirometry-Incruse Ellipta was added this admission 4. Consider cognitive evaluation/MMSE and further work-up in the outpatient setting for mild memory loss as per daughter  Discharge Diagnoses:  Active Problems:   Diverticulitis   Discharge Condition: Improved  Diet recommendation: Heart healthy  Filed Weights   01/16/20 1520 01/18/20 0807  Weight: 68.5 kg 71.2 kg    History of present illness:  85 year old white female Desiree [name], Desiree [name] Recent treatment for bronchitis several rounds of antibiotics Levaquin and Omnicef Immunized against coronavirus + booster   Comes to ED with 1 month history of nausea vomiting in the setting of recent treatment with multiple rounds of antibiotics for bronchitis She was also hypocalcemic hypokalemic diverticulitis  It was felt that some of her bronchitis issues may also have another etiology so further work-up was performed  Hospital Course:  1. Diverticulitis a. Narrowed from IV Unasyn --Diarrhea x3 today changing to cefdinir 300 q12/Flagyll 500 tid b. Diarrhea resolved and patient will complete therapy in the outpatient setting 2. ?  Cough-ruled out Diastolic heart failure a. Tells me over the past several months has had some swelling of her feet tightness of her rings b. BNP is elevated at 402 echo is negative c. Discontinue Lasix d. No further work-up as this is a not heart failure 3. COPD Gold stage II-III-emphysema a. Unlikely to have an infection b. As an outpatient was on Advair albuterol c. Added this admission Incruse Ellipta d. Repeat chest x-ray  12/31 2 view shows only emphysema e. Will need outpatient further management and PFTs- 4. Electrolyte abnormalities a. Hypokalemia -give K. Dur 40 twice daily t and continue monitoring and management b. hypocalcemia-corrected calcium increased therefore discontinue supplements 5. Depression a. Continue Celexa 20 which has been reordered 6. Reflux a. Continue Protonix 40 daily 7. [name] a. Losartan 100 held given infection b. Monitor trends   Procedures:  Echocardiogram (  Consultations:  None   Discharge Exam: Vitals:   01/20/20 2059 01/21/20 0710  BP:  (!) 146/77  Pulse:  64  Resp:  18  Temp:  98.2 F (36.8 C)  SpO2: 95% 95%    General: awake alert coherent no distress EOMI NCAT no focal deficit Cardiovascular: S1-S2 no murmur no rub no gallop RRR Respiratory: Clinically clear no added sound no rales no rhonchi Neurologically intact power 5/5 seen ambulating in hall  slight memory issues  Discharge Instructions   Discharge Instructions    Diet - low sodium heart healthy   Complete by: As directed    Discharge instructions   Complete by: As directed    Patient will need antibiotics to complete on 01/23/2020 for abdominal infection I would recommend outpatient testing as discussed previously with either neurology or primary care physician if there are cognitive deficits I would also recommend labs in about 1 week at her primary physician's office Therapy evaluated the patien and she was functional moving around comfortably in all directions and able to take care of herself and hence they did not require any home health orders You will notice some your blood pressure medication changed because of some dehydration and you may need to resume them  once you get outpatient labs take care and happy new year   Increase activity slowly   Complete by: As directed      Allergies as of 01/21/2020      Reactions   Doxycycline    rash   Levaquin [levofloxacin] Hives   Mucinex  [guaifenesin Er] Other (See Comments)   breakout   Percocet [oxycodone-acetaminophen] Hives   Prednisone    Azithromycin Rash      Medication List    STOP taking these medications   hydrochlorothiazide 25 MG tablet Commonly known as: HYDRODIURIL   losartan 100 MG tablet Commonly known as: COZAAR   potassium chloride 10 MEQ tablet Commonly known as: KLOR-CON     TAKE these medications   albuterol (2.5 MG/3ML) 0.083% nebulizer solution Commonly known as: PROVENTIL Take 3 mLs (2.5 mg total) by nebulization every 6 (six) hours as needed for wheezing or shortness of breath.   cefdinir 300 MG capsule Commonly known as: OMNICEF Take 1 capsule (300 mg total) by mouth every 12 (twelve) hours.   chlorpheniramine-HYDROcodone 10-8 MG/5ML Suer Commonly known as: TUSSIONEX Take 5 mLs by mouth every 12 (twelve) hours.   citalopram 20 MG tablet Commonly known as: CELEXA Take 1 tablet (20 mg total) by mouth daily.   Fluticasone-Salmeterol 250-50 MCG/DOSE Aepb Commonly known as: Advair Diskus Inhale 1 puff into the lungs 2 (two) times daily.   gabapentin 100 MG capsule Commonly known as: NEURONTIN Take 1-3 capsules (100-300 mg total) by mouth at bedtime. For nerve pain What changed:   when to take this  reasons to take this   iron polysaccharides 150 MG capsule Commonly known as: Nu-Iron Take 1 capsule (150 mg total) by mouth daily. What changed:   when to take this  additional instructions   Melatonin 10 MG Tabs Take 10 mg by mouth daily as needed.   metroNIDAZOLE 500 MG tablet Commonly known as: FLAGYL Take 1 tablet (500 mg total) by mouth 3 (three) times daily.   montelukast 10 MG tablet Commonly known as: SINGULAIR Take 1 tablet (10 mg total) by mouth at bedtime.   ondansetron 4 MG disintegrating tablet Commonly known as: Zofran ODT Take 1 tablet (4 mg total) by mouth every 8 (eight) hours as needed for nausea or vomiting.   pantoprazole 40 MG  tablet Commonly known as: PROTONIX Take 1 tablet (40 mg total) by mouth daily.   potassium chloride SA 20 MEQ tablet Commonly known as: KLOR-CON Take 2 tablets (40 mEq total) by mouth 2 (two) times daily.   umeclidinium bromide 62.5 MCG/INH Aepb Commonly known as: INCRUSE ELLIPTA Inhale 1 puff into the lungs daily. Start taking on: January 22, 2020      Allergies  Allergen Reactions  . Doxycycline     rash  . Levaquin [Levofloxacin] Hives  . Mucinex [Guaifenesin Er] Other (See Comments)    breakout  . Percocet [Oxycodone-Acetaminophen] Hives  . Prednisone   . Azithromycin Rash      The results of significant diagnostics from this hospitalization (including imaging, microbiology, ancillary and laboratory) are listed below for reference.    Significant Diagnostic Studies: CT ABDOMEN PELVIS WO CONTRAST  Result Date: 01/17/2020 CLINICAL DATA:  Left lower quadrant abdominal pain EXAM: CT ABDOMEN AND PELVIS WITHOUT CONTRAST TECHNIQUE: Multidetector CT imaging of the abdomen and pelvis was performed following the standard protocol without IV contrast. COMPARISON:  None. FINDINGS: Lower chest: The visualized heart size within normal limits. No pericardial fluid/thickening. No hiatal hernia. The  visualized portions of the lungs are clear. Hepatobiliary: There is diffuse low density seen throughout the liver parenchyma. No evidence of calcified gallstones or biliary ductal dilatation. Pancreas:  Unremarkable.  No surrounding inflammatory changes. Spleen: Normal in size. Although limited due to the lack of intravenous contrast, normal in appearance. Adrenals/Urinary Tract: Both adrenal glands appear normal. The kidneys and collecting system appear normal without evidence of urinary tract calculus or hydronephrosis. Bladder is unremarkable. Stomach/Bowel: The stomach, small bowel, are grossly unremarkable. There is a focal segment of descending colon measuring 9 cm in length with diffuse wall  thickening scattered colonic diverticula significant surrounding fat stranding changes. No free air or loculated fluid collections however are noted. Stranding changes extend into the left pericolic gutter. Vascular/Lymphatic: There are no enlarged abdominal or pelvic lymph nodes. Scattered aortic atherosclerotic calcifications are seen without aneurysmal dilatation. Reproductive: The uterus and adnexa are unremarkable. Other: A small amount of free fluid is seen within the deep pelvis. Musculoskeletal: No acute or significant osseous findings. Bilateral total hip arthroplasties are noted. Chronic bilateral pars defects are seen at L5 with a grade 1 anterolisthesis. Cement fixation noted within T12 and L1 vertebral bodies. IMPRESSION: Findings consistent with acute descending colon diverticulitis with significant surrounding inflammatory changes. No loculated fluid collection or free air. Hepatic steatosis Aortic Atherosclerosis (ICD10-I70.0). Electronically Signed   By: Jonna Clark M.D.   On: 01/17/2020 00:18   DG Chest 2 View  Result Date: 01/20/2020 CLINICAL DATA:  One month history of nausea and vomiting, history of bronchitis and asthma EXAM: CHEST - 2 VIEW COMPARISON:  12/22/2019 FINDINGS: Frontal and lateral views of the chest demonstrate an unremarkable cardiac silhouette. The lungs are hyperinflated, with chronic interstitial scarring. Trace right pleural effusion. No airspace disease or pneumothorax. No acute bony abnormalities. Stable vertebral augmentation lower thoracic spine. IMPRESSION: 1. Stable emphysema. 2. Trace right pleural effusion. Electronically Signed   By: Sharlet Salina M.D.   On: 01/20/2020 16:43   DG Chest Port 1 View  Result Date: 12/22/2019 CLINICAL DATA:  Nausea, vomiting and diarrhea since 12/15/2019. EXAM: PORTABLE CHEST 1 VIEW COMPARISON:  PA and lateral chest 12/19/2019 and 02/26/2018. FINDINGS: Lungs clear. The chest is hyperexpanded with attenuation of the pulmonary  vasculature. Heart size normal. Aortic atherosclerosis. No pneumothorax or pleural fluid. No acute or focal bony abnormality. IMPRESSION: No acute disease. Aortic Atherosclerosis (ICD10-I70.0) and Emphysema (ICD10-J43.9). Electronically Signed   By: Drusilla Kanner M.D.   On: 12/22/2019 19:26   ECHOCARDIOGRAM COMPLETE  Result Date: 01/19/2020    ECHOCARDIOGRAM REPORT   Patient Name:   Desiree [name] Date of Exam: 01/19/2020 Medical Rec #:  [id number]      Height:       63.0 in Accession #:    [id number]     Weight:       157.0 lb Date of Birth:  [birth year]       BSA:          1.744 m Patient Age:    84 years       BP:           117/82 mmHg Patient Gender: F              HR:           74 bpm. Exam Location:  Inpatient Procedure: 2D Echo, Cardiac Doppler and Color Doppler Indications:    CHF  History:        Patient has no prior history of Echocardiogram examinations.  COPD, Signs/Symptoms:Dyspnea; Risk Factors:Hypertension and                 Dyslipidemia. Bronchitis, COPD Gold.  Sonographer:    Dustin Flock Referring Phys: [phone] [facility]  Sonographer Comments: Image acquisition challenging due to COPD. IMPRESSIONS  1. Left ventricular ejection fraction, by estimation, is 60 to 65%. The left ventricle has normal function. The left ventricle has no regional wall motion abnormalities. Left ventricular diastolic parameters were normal.  2. Right ventricular systolic function is normal. The right ventricular size is normal. There is normal pulmonary artery systolic pressure.  3. The mitral valve is normal in structure. Mild mitral valve regurgitation. No evidence of mitral stenosis.  4. The aortic valve is tricuspid. Aortic valve regurgitation is not visualized. Mild to moderate aortic valve sclerosis/calcification is present, without any evidence of aortic stenosis.  5. The inferior vena cava is normal in size with greater than 50% respiratory variability, suggesting right atrial  pressure of 3 mmHg. FINDINGS  Left Ventricle: Left ventricular ejection fraction, by estimation, is 60 to 65%. The left ventricle has normal function. The left ventricle has no regional wall motion abnormalities. The left ventricular internal cavity size was normal in size. There is  no left ventricular hypertrophy. Left ventricular diastolic parameters were normal. Right Ventricle: The right ventricular size is normal. No increase in right ventricular wall thickness. Right ventricular systolic function is normal. There is normal pulmonary artery systolic pressure. The tricuspid regurgitant velocity is 2.42 m/s, and  with an assumed right atrial pressure of 3 mmHg, the estimated right ventricular systolic pressure is 25.4 mmHg. Left Atrium: Left atrial size was normal in size. Right Atrium: Right atrial size was normal in size. Pericardium: There is no evidence of pericardial effusion. Mitral Valve: The mitral valve is normal in structure. Mild mitral valve regurgitation. No evidence of mitral valve stenosis. Tricuspid Valve: The tricuspid valve is normal in structure. Tricuspid valve regurgitation is trivial. No evidence of tricuspid stenosis. Aortic Valve: The aortic valve is tricuspid. Aortic valve regurgitation is not visualized. Mild to moderate aortic valve sclerosis/calcification is present, without any evidence of aortic stenosis. Pulmonic Valve: The pulmonic valve was normal in structure. Pulmonic valve regurgitation is not visualized. No evidence of pulmonic stenosis. Aorta: The aortic root is normal in size and structure. Venous: The inferior vena cava is normal in size with greater than 50% respiratory variability, suggesting right atrial pressure of 3 mmHg. IAS/Shunts: No atrial level shunt detected by color flow Doppler.  LEFT VENTRICLE PLAX 2D LVIDd:         4.10 cm  Diastology LVIDs:         2.80 cm  LV e' medial:    9.46 cm/s LV PW:         1.00 cm  LV E/e' medial:  10.1 LV IVS:        1.00 cm  LV  e' lateral:   10.80 cm/s LVOT diam:     1.90 cm  LV E/e' lateral: 8.8 LV SV:         56 LV SV Index:   32 LVOT Area:     2.84 cm  RIGHT VENTRICLE RV Basal diam:  2.80 cm RV S prime:     9.25 cm/s TAPSE (M-mode): 2.5 cm LEFT ATRIUM             Index       RIGHT ATRIUM           Index  LA diam:        2.50 cm 1.43 cm/m  RA Area:     13.90 cm LA Vol (A2C):   28.2 ml 16.17 ml/m RA Volume:   38.50 ml  22.07 ml/m LA Vol (A4C):   25.7 ml 14.73 ml/m LA Biplane Vol: 28.0 ml 16.05 ml/m  AORTIC VALVE LVOT Vmax:   99.80 cm/s LVOT Vmean:  63.000 cm/s LVOT VTI:    0.197 m  AORTA Ao Root diam: 2.60 cm MITRAL VALVE               TRICUSPID VALVE MV Area (PHT): 3.42 cm    TR Peak grad:   23.4 mmHg MV Decel Time: 222 msec    TR Vmax:        242.00 cm/s MV E velocity: 95.50 cm/s MV A velocity: 67.30 cm/s  SHUNTS MV E/A ratio:  1.42        Systemic VTI:  0.20 m                            Systemic Diam: 1.90 cm Donato Schultz MD Electronically signed by Donato Schultz MD Signature Date/Time: 01/19/2020/2:31:57 PM    Final     Microbiology: Recent Results (from the past 240 hour(s))  Resp Panel by RT-PCR (Flu A&B, Covid) Nasopharyngeal Swab     Status: None   Collection Time: 01/16/20  3:28 PM   Specimen: Nasopharyngeal Swab; Nasopharyngeal(NP) swabs in vial transport medium  Result Value Ref Range Status   SARS Coronavirus 2 by RT PCR NEGATIVE NEGATIVE Final    Comment: (NOTE) SARS-CoV-2 target nucleic acids are NOT DETECTED.  The SARS-CoV-2 RNA is generally detectable in upper respiratory specimens during the acute phase of infection. The lowest concentration of SARS-CoV-2 viral copies this assay can detect is 138 copies/mL. A negative result does not preclude SARS-Cov-2 infection and should not be used as the sole basis for treatment or other patient management decisions. A negative result may occur with  improper specimen collection/handling, submission of specimen other than nasopharyngeal swab, presence of viral  mutation(s) within the areas targeted by this assay, and inadequate number of viral copies(<138 copies/mL). A negative result must be combined with clinical observations, patient history, and epidemiological information. The expected result is Negative.  Fact Sheet for Patients:  BloggerCourse.com  Fact Sheet for Healthcare Providers:  SeriousBroker.it  This test is no t yet approved or cleared by the [place] FDA and  has been authorized for detection and/or diagnosis of SARS-CoV-2 by FDA under an Emergency Use Authorization (EUA). This EUA will remain  in effect (meaning this test can be used) for the duration of the COVID-19 declaration under Section 564(b)(1) of the Act, 21 U.S.C.section 360bbb-3(b)(1), unless the authorization is terminated  or revoked sooner.       Influenza A by PCR NEGATIVE NEGATIVE Final   Influenza B by PCR NEGATIVE NEGATIVE Final    Comment: (NOTE) The Xpert Xpress SARS-CoV-2/FLU/RSV plus assay is intended as an aid in the diagnosis of influenza from Nasopharyngeal swab specimens and should not be used as a sole basis for treatment. Nasal washings and aspirates are unacceptable for Xpert Xpress SARS-CoV-2/FLU/RSV testing.  Fact Sheet for Patients: BloggerCourse.com  Fact Sheet for Healthcare Providers: SeriousBroker.it  This test is not yet approved or cleared by the [place] FDA and has been authorized for detection and/or diagnosis of SARS-CoV-2 by FDA under an Emergency Use Authorization (EUA).  This EUA will remain in effect (meaning this test can be used) for the duration of the COVID-19 declaration under Section 564(b)(1) of the Act, 21 U.S.C. section 360bbb-3(b)(1), unless the authorization is terminated or revoked.  Performed at [facility], [address] [place], [place] [address]   C Difficile Quick Screen w PCR  reflex     Status: None   Collection Time: 01/18/20  4:01 PM   Specimen: STOOL  Result Value Ref Range Status   C Diff antigen NEGATIVE NEGATIVE Final   C Diff toxin NEGATIVE NEGATIVE Final   C Diff interpretation No C. difficile detected.  Final    Comment: Performed at [facility], [address], [place], [place] [address]     Labs: Basic Metabolic Panel: Recent Labs  Lab 01/17/20 1145 01/18/20 0514 01/19/20 0548 01/20/20 0549 01/21/20 0546  NA 139 142 139 143 141  K 3.3* 3.5 3.2* 3.6 4.0  CL 102 107 107 110 110  CO2 28 26 21* 23 24  GLUCOSE 94 89 85 101* 101*  BUN 20 15 9 9 12   CREATININE 1.13* 0.92 0.73 0.89 0.77  CALCIUM 7.8* 7.7* 7.3* 7.7* 8.1*  MG 1.6*  --   --   --   --    Liver Function Tests: Recent Labs  Lab 01/16/20 2300 01/18/20 0514 01/19/20 0548 01/20/20 0549 01/21/20 0546  AST 14* 15 13* 16 17  ALT 12 12 12 15 16   ALKPHOS 39 36* 29* 38 41  BILITOT 0.9 0.8 0.3 0.3 0.5  PROT 5.5* 5.5* 4.4* 5.6* 5.6*  ALBUMIN 2.8* 2.7* 2.1* 2.8* 2.8*   Recent Labs  Lab 01/16/20 2300  LIPASE 23   No results for input(s): AMMONIA in the last 168 hours. CBC: Recent Labs  Lab 01/16/20 2300 01/18/20 0514 01/19/20 0548 01/20/20 0549 01/21/20 0546  WBC 11.7* 6.8 4.5 5.5 6.5  NEUTROABS 7.6  --  2.6 2.8 3.8  HGB 11.2* 10.6* 8.8* 10.8* 10.6*  HCT 33.8* 32.6* 27.5* 33.2* 32.9*  MCV 90.6 93.4 92.9 92.7 94.0  PLT 290 297 262 323 317   Cardiac Enzymes: No results for input(s): CKTOTAL, CKMB, CKMBINDEX, TROPONINI in the last 168 hours. BNP: BNP (last 3 results) Recent Labs    01/19/20 0548  BNP 403.9*    ProBNP (last 3 results) No results for input(s): PROBNP in the last 8760 hours.  CBG: No results for input(s): GLUCAP in the last 168 hours.     Signed:  03/20/20 MD   [facility] 01/21/2020, 9:55 AM

## 2020-01-21 NOTE — Progress Notes (Signed)
Discharge instructions reviewed with patient using teach back method. Patient being discharged to home

## 2020-01-21 NOTE — Evaluation (Addendum)
Physical Therapy Evaluation Patient Details Name: Desiree [name] MRN: [id number] DOB: Jun 18, 1935 Today's Date: 01/21/2020   History of Present Illness  Pt admitted with c/o abd pain and diarrhea and dx with diverticulitis without perforation.  Pt with hx of Spinal stenosis, lumbar compression fx s/p kyphoplasty, COPD and Bil hip surgery  Clinical Impression  Pt admitted as above but now with pain/diarrhea resolved.  On arrival to room, pt up in bathroom preparing to bathe.  Pt unassisted to mobilize in room and to ambulate in halls including side-stepping, back stepping and direction changes.  Pt with no LOB and able to complete all tasks but admits to fatiguing slightly with activity.  Pt states she feels she is at or close to base line and is eager for dc home this date.    Follow Up Recommendations No PT follow up    Equipment Recommendations  None recommended by PT    Recommendations for Other Services       Precautions / Restrictions Precautions Precautions: Fall Restrictions Weight Bearing Restrictions: No      Mobility  Bed Mobility Overal bed mobility: Modified Independent                  Transfers Overall transfer level: Modified independent Equipment used: None                Ambulation/Gait Ambulation/Gait assistance: Independent Gait Distance (Feet): 250 Feet Assistive device: None Gait Pattern/deviations: Step-through pattern;Decreased step length - right;Decreased step length - left;Shuffle;Trunk flexed Gait velocity: decr   General Gait Details: Pt ambulated 250' unassisted including fwd, bkd, turning with no LOB and with good safety awareness.  Stairs            Wheelchair Mobility    Modified Rankin (Stroke Patients Only)       Balance Overall balance assessment: Independent                                           Pertinent Vitals/Pain Pain Assessment: No/denies pain    Home Living Family/patient  expects to be discharged to:: Private residence Living Arrangements: Alone;Children Available Help at Discharge: Available PRN/intermittently Type of Home: Apartment Home Access: Level entry;Elevator     Home Layout: One level Home Equipment: None Additional Comments: Pt lives at [facility]    Prior Function Level of Independence: Independent         Comments: Pt does not drive     Hand Dominance        Extremity/Trunk Assessment   Upper Extremity Assessment Upper Extremity Assessment: Overall WFL for tasks assessed    Lower Extremity Assessment Lower Extremity Assessment: Overall WFL for tasks assessed    Cervical / Trunk Assessment Cervical / Trunk Assessment: Kyphotic  Communication   Communication: HOH  Cognition Arousal/Alertness: Awake/alert Behavior During Therapy: WFL for tasks assessed/performed Overall Cognitive Status: Within Functional Limits for tasks assessed                                        General Comments      Exercises     Assessment/Plan    PT Assessment Patent does not need any further PT services  PT Problem List Decreased activity tolerance       PT Treatment Interventions Gait  training;Functional mobility training;Balance training    PT Goals (Current goals can be found in the Care Plan section)  Acute Rehab PT Goals Patient Stated Goal: HOME PT Goal Formulation: All assessment and education complete, DC therapy    Frequency Min 1X/week   Barriers to discharge        Co-evaluation               AM-PAC PT "6 Clicks" Mobility  Outcome Measure Help needed turning from your back to your side while in a flat bed without using bedrails?: None Help needed moving from lying on your back to sitting on the side of a flat bed without using bedrails?: None Help needed moving to and from a bed to a chair (including a wheelchair)?: None Help needed standing up from a chair using your arms (e.g.,  wheelchair or bedside chair)?: None Help needed to walk in hospital room?: None Help needed climbing 3-5 steps with a railing? : A Little 6 Click Score: 23    End of Session Equipment Utilized During Treatment: Gait belt Activity Tolerance: Patient tolerated treatment well Patient left: in chair;with call bell/phone within reach Nurse Communication: Mobility status PT Visit Diagnosis: Difficulty in walking, not elsewhere classified (R26.2)    Time: 9604-5409 PT Time Calculation (min) (ACUTE ONLY): 18 min   Charges:   PT Evaluation $PT Eval Low Complexity: 1 Low          Mauro Kaufmann PT Acute Rehabilitation Services Pager [phone] Office [phone]   Nykeem Citro 01/21/2020, 10:03 AM

## 2020-01-30 ENCOUNTER — Telehealth: Payer: Self-pay | Admitting: Internal Medicine

## 2020-01-30 ENCOUNTER — Other Ambulatory Visit: Payer: Self-pay

## 2020-01-30 ENCOUNTER — Ambulatory Visit (INDEPENDENT_AMBULATORY_CARE_PROVIDER_SITE_OTHER): Payer: Medicare Other | Admitting: Internal Medicine

## 2020-01-30 ENCOUNTER — Encounter: Payer: Self-pay | Admitting: Internal Medicine

## 2020-01-30 VITALS — BP 130/84 | HR 80 | Temp 97.9°F | Ht 63.0 in | Wt 159.2 lb

## 2020-01-30 DIAGNOSIS — K5792 Diverticulitis of intestine, part unspecified, without perforation or abscess without bleeding: Secondary | ICD-10-CM | POA: Diagnosis not present

## 2020-01-30 DIAGNOSIS — I10 Essential (primary) hypertension: Secondary | ICD-10-CM

## 2020-01-30 DIAGNOSIS — R609 Edema, unspecified: Secondary | ICD-10-CM | POA: Diagnosis not present

## 2020-01-30 DIAGNOSIS — J449 Chronic obstructive pulmonary disease, unspecified: Secondary | ICD-10-CM

## 2020-01-30 DIAGNOSIS — K219 Gastro-esophageal reflux disease without esophagitis: Secondary | ICD-10-CM | POA: Diagnosis not present

## 2020-01-30 DIAGNOSIS — E538 Deficiency of other specified B group vitamins: Secondary | ICD-10-CM

## 2020-01-30 DIAGNOSIS — E876 Hypokalemia: Secondary | ICD-10-CM | POA: Diagnosis not present

## 2020-01-30 DIAGNOSIS — R600 Localized edema: Secondary | ICD-10-CM

## 2020-01-30 DIAGNOSIS — R413 Other amnesia: Secondary | ICD-10-CM | POA: Diagnosis not present

## 2020-01-30 DIAGNOSIS — R739 Hyperglycemia, unspecified: Secondary | ICD-10-CM

## 2020-01-30 MED ORDER — PANTOPRAZOLE SODIUM 40 MG PO TBEC: 40.0000 mg | DELAYED_RELEASE_TABLET | Freq: Every day | ORAL | 3 refills | Status: AC

## 2020-01-30 NOTE — Assessment & Plan Note (Signed)
Resolved, cont to monitor s/s for any recurrence

## 2020-01-30 NOTE — Assessment & Plan Note (Signed)
Recent worsening, likely at least mild cognitive impairment, for f/u b12, MRI brain, and neurology referral

## 2020-01-30 NOTE — Assessment & Plan Note (Signed)
Ok fro restart hct 12.5 qd,  to f/u any worsening symptoms or concerns

## 2020-01-30 NOTE — Assessment & Plan Note (Signed)
Symptomatically improved, cont incruse ellipta

## 2020-01-30 NOTE — Patient Instructions (Signed)
Please take all new medication as prescribed - the HCT  Ok to stay off the losartan for now  You will be contacted regarding the referral for: MRI and neurology  Please continue all other medications as before, including to restart the protonix  Please have the pharmacy call with any other refills you may need.  Please keep your appointments with your specialists as you may have planned  Please go to the LAB at the blood drawing area for the tests to be done - at the [facility] lab  You will be contacted by phone if any changes need to be made immediately.  Otherwise, you will receive a letter about your results with an explanation, but please check with MyChart first.  Please remember to sign up for MyChart if you have not done so, as this will be important to you in the future with finding out test results, communicating by private email, and scheduling acute appointments online when needed.  Please make an Appointment to return for your 1 month, or sooner if needed

## 2020-01-30 NOTE — Assessment & Plan Note (Signed)
For f/u lab and replace if necessary

## 2020-01-30 NOTE — Assessment & Plan Note (Signed)
Stable, cont off the losartan for now, to f/u 1 mo

## 2020-01-30 NOTE — Progress Notes (Signed)
Established Patient Office Visit  Subjective:  Patient ID: Desiree [name], female    DOB: May 14, 1935  Age: 85 y.o. MRN: [id number]  CC:  Chief Complaint  Patient presents with  . Hospitalization Follow-up    Hospital f/u from 01/16/20 for diverticulitis.  She is having bilateral feet swelling.      also f/u HTN, copd/cough, hypokalemia, and GERD  HPI Desiree [name] presents for above after recent hospn dec 27 - jan 1 with LLQ diverticulitis, d/c on cefdinr and flagyl now finished and doing well -  Pt denies fever, wt loss, night sweats, loss of appetite, or other constitutional symptoms  Denies worsening reflux, abd pain, dysphagia, n/v, bowel change or blood, except refux has been worse in the last few days having run out of PPI.  Incruse ellipta use with improved cough/dyspnea..  Did have significant low K, and today with persisent mild distal LE swelling after IVF in hosp and losartan/hct stopped with lower BPs.    Pt also noted to have worsening ST memory forgetfullness in last 2-3 months incidentally.  Now living in Independent living but likely to need assisted living soon per daughter.        Wt Readings from Last 3 Encounters:  01/30/20 159 lb 3.2 oz (72.2 kg)  01/18/20 156 lb 15.5 oz (71.2 kg)  12/22/19 151 lb (68.5 kg)   BP Readings from Last 3 Encounters:  01/30/20 130/84  01/21/20 (!) 146/77  12/22/19 (!) 148/73      Past Medical History:  Diagnosis Date  . Anxiety 02/13/2017  . Asthma   . B12 deficiency anemia 02/13/2017  . Chronic back pain 02/13/2017  . Chronic cough 02/13/2017  . COPD (chronic obstructive pulmonary disease) (HCC)   . Depression 02/13/2017  . Diverticulosis 02/13/2017  . Former smoker 2001  . GERD (gastroesophageal reflux disease) 02/13/2017  . Hard of hearing   . HLD (hyperlipidemia) 02/13/2017  . HTN (hypertension) 02/13/2017  . Lumbar compression fracture (HCC)   . Osteopenia 02/13/2017  . Pars defect of lumbar spine 09/02/2019   X-ray L-spine  August 2021 Bilateral pars defects at L5 with anterolisthesis of L5 on S1.   Marland Kitchen Spinal stenosis 02/13/2017   Past Surgical History:  Procedure Laterality Date  . APPENDECTOMY    . CATARACT EXTRACTION, BILATERAL    . KYPHOPLASTY  2010  . left hip surgury    . right hip surgury      reports that she has quit smoking. She has never used smokeless tobacco. She reports current alcohol use. She reports that she does not use drugs. family history includes Depression in her father; Lung cancer in her brother and sister; Mesothelioma in her son. Allergies  Allergen Reactions  . Doxycycline     rash  . Levaquin [Levofloxacin] Hives  . Mucinex [Guaifenesin Er] Other (See Comments)    breakout  . Percocet [Oxycodone-Acetaminophen] Hives  . Prednisone   . Azithromycin Rash   Current Outpatient Medications on File Prior to Visit  Medication Sig Dispense Refill  . albuterol (PROVENTIL) (2.5 MG/3ML) 0.083% nebulizer solution Take 3 mLs (2.5 mg total) by nebulization every 6 (six) hours as needed for wheezing or shortness of breath. 75 mL 12  . chlorpheniramine-HYDROcodone (TUSSIONEX) 10-8 MG/5ML SUER Take 5 mLs by mouth every 12 (twelve) hours. 140 mL 0  . citalopram (CELEXA) 20 MG tablet Take 1 tablet (20 mg total) by mouth daily. 90 tablet 3  . Fluticasone-Salmeterol (ADVAIR DISKUS) 250-50 MCG/DOSE AEPB Inhale 1  puff into the lungs 2 (two) times daily. 3 each 3  . iron polysaccharides (NU-IRON) 150 MG capsule Take 1 capsule (150 mg total) by mouth daily. (Patient taking differently: Take 150 mg by mouth See admin instructions. Takes 2 times a week) 90 capsule 1  . Melatonin 10 MG TABS Take 10 mg by mouth daily as needed.    . montelukast (SINGULAIR) 10 MG tablet Take 1 tablet (10 mg total) by mouth at bedtime. 90 tablet 1  . ondansetron (ZOFRAN ODT) 4 MG disintegrating tablet Take 1 tablet (4 mg total) by mouth every 8 (eight) hours as needed for nausea or vomiting. 20 tablet 0  . umeclidinium  bromide (INCRUSE ELLIPTA) 62.5 MCG/INH AEPB Inhale 1 puff into the lungs daily. 30 each 1  . cefdinir (OMNICEF) 300 MG capsule Take 1 capsule (300 mg total) by mouth every 12 (twelve) hours. (Patient not taking: Reported on 01/30/2020) 6 capsule 0  . gabapentin (NEURONTIN) 100 MG capsule Take 1-3 capsules (100-300 mg total) by mouth at bedtime. For nerve pain (Patient not taking: Reported on 01/30/2020) 30 capsule 3  . metroNIDAZOLE (FLAGYL) 500 MG tablet Take 1 tablet (500 mg total) by mouth 3 (three) times daily. (Patient not taking: Reported on 01/30/2020) 9 tablet 0  . potassium chloride SA (KLOR-CON) 20 MEQ tablet Take 2 tablets (40 mEq total) by mouth 2 (two) times daily. (Patient not taking: Reported on 01/30/2020) 8 tablet 0   No current facility-administered medications on file prior to visit.        ROS:  All others reviewed and negative.  Objective        PE:  BP 130/84   Pulse 80   Temp 97.9 F (36.6 C) (Oral)   Ht 5\' 3"  (1.6 m)   Wt 159 lb 3.2 oz (72.2 kg)   SpO2 96%   BMI 28.20 kg/m                 Constitutional: Pt appears in NAD               HENT: Head: NCAT.                Right Ear: External ear normal.                 Left Ear: External ear normal.                Eyes: . Pupils are equal, round, and reactive to light. Conjunctivae and EOM are normal               Nose: without d/c or deformity               Neck: Neck supple. Gross normal ROM               Cardiovascular: Normal rate and regular rhythm.                 Pulmonary/Chest: Effort normal and breath sounds without rales or wheezing.                Abd:  Soft, NT, ND, + BS, no organomegaly               Neurological: Pt is alert. At baseline orientation, motor grossly intact               Skin: Skin is warm. No rashes, no other new lesions, LE edema - 1+ bilat to knees  Psychiatric: Pt behavior is normal without agitation   Assessment/Plan:  Desiree [name] is a 85 y.o. White or Caucasian  [1] female with  has a past medical history of Anxiety (02/13/2017), Asthma, B12 deficiency anemia (02/13/2017), Chronic back pain (02/13/2017), Chronic cough (02/13/2017), COPD (chronic obstructive pulmonary disease) (HCC), Depression (02/13/2017), Diverticulosis (02/13/2017), Former smoker (2001), GERD (gastroesophageal reflux disease) (02/13/2017), Hard of hearing, HLD (hyperlipidemia) (02/13/2017), HTN (hypertension) (02/13/2017), Lumbar compression fracture (HCC), Osteopenia (02/13/2017), Pars defect of lumbar spine (09/02/2019), and Spinal stenosis (02/13/2017).   Assessment Plan  See problem oriented assessment and plan Labs reviewed for each problem: Lab Results  Component Value Date   WBC 6.5 01/21/2020   HGB 10.6 (L) 01/21/2020   HCT 32.9 (L) 01/21/2020   PLT 317 01/21/2020   GLUCOSE 101 (H) 01/21/2020   CHOL 201 (H) 07/02/2018   TRIG 124.0 07/02/2018   HDL 61.40 07/02/2018   LDLCALC 115 (H) 07/02/2018   ALT 16 01/21/2020   AST 17 01/21/2020   NA 141 01/21/2020   K 4.0 01/21/2020   CL 110 01/21/2020   CREATININE 0.77 01/21/2020   BUN 12 01/21/2020   CO2 24 01/21/2020   TSH 1.42 07/02/2018   HGBA1C 6.2 07/02/2018    Micro: none  Cardiac tracings I have personally interpreted today:  none  Pertinent Radiological findings (summarize): Jan 20 2020 cxr, and Jan 17 2020 ct abd pelvis    Health Maintenance Due  Topic Date Due  . TETANUS/TDAP  Never done  . DEXA SCAN  Never done  . PNA vac Low Risk Adult (2 of 2 - PPSV23) 04/06/2017    There are no preventive care reminders to display for this patient.  Lab Results  Component Value Date   TSH 1.42 07/02/2018   Lab Results  Component Value Date   WBC 6.5 01/21/2020   HGB 10.6 (L) 01/21/2020   HCT 32.9 (L) 01/21/2020   MCV 94.0 01/21/2020   PLT 317 01/21/2020   Lab Results  Component Value Date   NA 141 01/21/2020   K 4.0 01/21/2020   CO2 24 01/21/2020   GLUCOSE 101 (H) 01/21/2020   BUN 12 01/21/2020   CREATININE  0.77 01/21/2020   BILITOT 0.5 01/21/2020   ALKPHOS 41 01/21/2020   AST 17 01/21/2020   ALT 16 01/21/2020   PROT 5.6 (L) 01/21/2020   ALBUMIN 2.8 (L) 01/21/2020   CALCIUM 8.1 (L) 01/21/2020   ANIONGAP 7 01/21/2020   GFR 48.59 (L) 03/18/2019   Lab Results  Component Value Date   CHOL 201 (H) 07/02/2018   Lab Results  Component Value Date   HDL 61.40 07/02/2018   Lab Results  Component Value Date   LDLCALC 115 (H) 07/02/2018   Lab Results  Component Value Date   TRIG 124.0 07/02/2018   Lab Results  Component Value Date   CHOLHDL 3 07/02/2018   Lab Results  Component Value Date   HGBA1C 6.2 07/02/2018      Assessment & Plan:   Problem List Items Addressed This Visit      High   Memory loss    Recent worsening, likely at least mild cognitive impairment, for f/u b12, MRI brain, and neurology referral      Relevant Orders   MR Brain Wo Contrast   Ambulatory referral to Neurology   COPD (chronic obstructive pulmonary disease) (HCC)    Symptomatically improved, cont incruse ellipta        Medium   Peripheral edema  Ok fro restart hct 12.5 qd,  to f/u any worsening symptoms or concerns      Hypokalemia    Mild, likely related to reduced po intake and GI loss with recent illness, for f/u lab      Hyperglycemia   HTN (hypertension)    Stable, cont off the losartan for now, to f/u 1 mo      GERD (gastroesophageal reflux disease)    With mild breakthrough symptoms, for restart PPI,  to f/u any worsening symptoms or concerns      Relevant Medications   pantoprazole (PROTONIX) 40 MG tablet   Diverticulitis - Primary    Resolved, cont to monitor s/s for any recurrence      Relevant Orders   CBC with Differential/Platelet   Hepatic function panel   Basic metabolic panel   [id number] deficiency    For f/u lab and replace if necessary      Relevant Orders   Vitamin B12      Meds ordered this encounter  Medications  . pantoprazole (PROTONIX) 40 MG  tablet    Sig: Take 1 tablet (40 mg total) by mouth daily.    Dispense:  90 tablet    Refill:  3    Follow-up: Return in about 6 months (around 07/29/2020).   Oliver BarreJames Lanard Arguijo, MD 01/30/2020 9:50 PM [facility]

## 2020-01-30 NOTE — Telephone Encounter (Signed)
LVM for pt to rtn my call at [phone] to schedule AWV with [facility]. Please schedule this appt when pt comes to the office today.

## 2020-01-30 NOTE — Assessment & Plan Note (Signed)
Mild, likely related to reduced po intake and GI loss with recent illness, for f/u lab

## 2020-01-30 NOTE — Assessment & Plan Note (Signed)
With mild breakthrough symptoms, for restart PPI,  to f/u any worsening symptoms or concerns

## 2020-02-08 ENCOUNTER — Ambulatory Visit: Payer: Medicare Other

## 2020-02-17 ENCOUNTER — Encounter: Payer: Self-pay | Admitting: Internal Medicine

## 2020-02-21 ENCOUNTER — Encounter: Payer: Self-pay | Admitting: Internal Medicine

## 2020-03-22 ENCOUNTER — Telehealth: Payer: Self-pay | Admitting: Internal Medicine

## 2020-03-22 DIAGNOSIS — J449 Chronic obstructive pulmonary disease, unspecified: Secondary | ICD-10-CM

## 2020-03-22 NOTE — Telephone Encounter (Signed)
Patients daughter called and was wondering if a referral to a pulmonologist could be placed. She said that the patient has COPD. Her last OV was 1.10.22. She said that the patient is still coughing up dark phlegm after seeing PCP in January. Please advise. She can be reached at [phone].

## 2020-03-22 NOTE — Telephone Encounter (Signed)
Ok this is done  This can take more than 30 days due to covid situation

## 2020-03-22 NOTE — Telephone Encounter (Signed)
Ok this is done 

## 2020-03-23 NOTE — Telephone Encounter (Signed)
Left voicemail on daughter (cindy) number.  This can take more than 30 days due to covid situation

## 2020-04-06 ENCOUNTER — Telehealth: Payer: Self-pay | Admitting: Internal Medicine

## 2020-04-06 NOTE — Telephone Encounter (Signed)
LVM for pt to rtn my call to schedule awv with nha. Please schedule appt if pt calls the office.  

## 2020-04-24 ENCOUNTER — Other Ambulatory Visit: Payer: Self-pay | Admitting: Family Medicine

## 2020-04-25 NOTE — Telephone Encounter (Signed)
Please advise 

## 2020-04-26 NOTE — Progress Notes (Signed)
Desiree [name], am serving as a Neurosurgeon for Dr. Clementeen Graham.  Desiree [name] is a 85 y.o. female who presents to [facility] at [facility] today for R knee pain. Pt was last seen by Dr. Denyse [name] on 09/01/19 and was advised to proceed to NCV study which was never completed. Pt had last R knee steroid injection on 07/29/19. Today, pt reports that her R knee started bothering her a month ago and feels worse than the last time. Right knee is swollen and feels like it is time to drain it again. Locates pain to posterior knee.    She notes that she still does have some paresthesias but notes that gabapentin is very helpful at bedtime.  She takes 100 mg at bedtime and would like to continue it.  She tolerates the medicine well.   R knee swelling: yes Aggravates: walking; sitting to standing Treatments tried:   Dx imaging: 12/21/18 R knee XR  Pertinent review of systems: No fevers or chills  Relevant historical information: Hypertension, COPD   Exam:  BP 118/70 (BP Location: Left Arm, Patient Position: Sitting, Cuff Size: Normal)   Pulse 78   Ht 5\' 3"  (1.6 m)   Wt 155 lb (70.3 kg)   SpO2 98%   BMI 27.46 kg/m  General: Well Developed, well nourished, and in no acute distress.   MSK: Right knee mild knee effusion. Normal motion with crepitation. Mildly tender palpation medial joint line. Nontender posterior knee. Stable ligamentous exam.   Lab and Radiology Results  Procedure: Real-time Ultrasound Guided Injection of right knee superior lateral patellar space. Device: Philips Affiniti 50G Due to technical error images were not able to be stored.  Patient will not be charged for the ultrasound component of the injection.   Baker's cyst visualized prior to injection and was very small.  After discussion plan to proceed just with intra-articular steroid injection. Verbal informed consent obtained.  Discussed risks and benefits of procedure. Warned about infection  bleeding damage to structures skin hypopigmentation and fat atrophy among others. Patient expresses understanding and agreement Time-out conducted.   Noted no overlying erythema, induration, or other signs of local infection.   Skin prepped in a sterile fashion.   Local anesthesia: Topical Ethyl chloride.   With sterile technique and under real time ultrasound guidance:  40 mg of Kenalog and 2 mL of Marcaine injected into knee joint. Fluid seen entering the joint capsule.   Completed without difficulty   Pain immediately resolved suggesting accurate placement of the medication.   Advised to call if fevers/chills, erythema, induration, drainage, or persistent bleeding.   Impression: Technically successful ultrasound guided injection.     Assessment and Plan: 85 y.o. female with right knee pain mostly due to DJD.  Patient does have a Baker's cyst but I do not think it is very much contributory to her pain.  Plan for steroid injection today and recheck back as needed.  Paresthesia continue gabapentin as needed.  Recheck as needed.   PDMP not reviewed this encounter. Orders Placed This Encounter  Procedures  . 97 LIMITED JOINT SPACE STRUCTURES LOW RIGHT(NO LINKED CHARGES)    Standing Status:   Future    Number of Occurrences:   1    Standing Expiration Date:   04/27/2021    Order Specific Question:   Reason for Exam (SYMPTOM  OR DIAGNOSIS REQUIRED)    Answer:   Right knee pain    Order Specific Question:   Preferred  imaging location?    Answer:   Internal   Meds ordered this encounter  Medications  . gabapentin (NEURONTIN) 100 MG capsule    Sig: Take 1 capsule (100 mg total) by mouth at bedtime.    Dispense:  90 capsule    Refill:  3     Discussed warning signs or symptoms. Please see discharge instructions. Patient expresses understanding.   The above documentation has been reviewed and is accurate and complete Clementeen Graham, M.D.

## 2020-04-27 ENCOUNTER — Ambulatory Visit: Payer: Self-pay

## 2020-04-27 ENCOUNTER — Other Ambulatory Visit: Payer: Self-pay | Admitting: Internal Medicine

## 2020-04-27 ENCOUNTER — Encounter: Payer: Self-pay | Admitting: Family Medicine

## 2020-04-27 ENCOUNTER — Other Ambulatory Visit: Payer: Self-pay

## 2020-04-27 ENCOUNTER — Ambulatory Visit (INDEPENDENT_AMBULATORY_CARE_PROVIDER_SITE_OTHER): Payer: Medicare Other | Admitting: Family Medicine

## 2020-04-27 VITALS — BP 118/70 | HR 78 | Ht 63.0 in | Wt 155.0 lb

## 2020-04-27 DIAGNOSIS — R202 Paresthesia of skin: Secondary | ICD-10-CM

## 2020-04-27 DIAGNOSIS — M25561 Pain in right knee: Secondary | ICD-10-CM

## 2020-04-27 DIAGNOSIS — G8929 Other chronic pain: Secondary | ICD-10-CM

## 2020-04-27 DIAGNOSIS — J45909 Unspecified asthma, uncomplicated: Secondary | ICD-10-CM

## 2020-04-27 MED ORDER — GABAPENTIN 100 MG PO CAPS: 100.0000 mg | ORAL_CAPSULE | Freq: Every day | ORAL | 3 refills | Status: AC

## 2020-04-27 NOTE — Patient Instructions (Signed)
Good to see you   Injection in Right knee today  See me again when you need me

## 2020-04-27 NOTE — Telephone Encounter (Signed)
Please refill as per office routine med refill policy (all routine meds refilled for 3 mo or monthly per pt preference up to one year from last visit, then month to month grace period for 3 mo, then further med refills will have to be denied)  

## 2020-05-18 ENCOUNTER — Telehealth: Payer: Self-pay | Admitting: Internal Medicine

## 2020-05-18 NOTE — Telephone Encounter (Signed)
We are sorry for the long wait, but pulmonary has been overwhelmed due to covid ; I will forward to [facility]

## 2020-05-18 NOTE — Telephone Encounter (Signed)
Patients daughter called and was wondering the status of the pulmonologist referral. She can be reached at [phone]. She said that it was okay to leave a voicemail. Please advise

## 2020-05-21 ENCOUNTER — Ambulatory Visit: Payer: Medicare Other | Admitting: Internal Medicine

## 2020-05-21 ENCOUNTER — Other Ambulatory Visit: Payer: Self-pay | Admitting: Internal Medicine

## 2020-05-21 NOTE — Telephone Encounter (Signed)
Called [facility] and they stated they would contact pt - left detailed vm to pt's dtr to inform

## 2020-05-22 ENCOUNTER — Other Ambulatory Visit: Payer: Self-pay

## 2020-05-22 ENCOUNTER — Ambulatory Visit (INDEPENDENT_AMBULATORY_CARE_PROVIDER_SITE_OTHER): Payer: Medicare Other | Admitting: Pulmonary Disease

## 2020-05-22 ENCOUNTER — Encounter: Payer: Self-pay | Admitting: Pulmonary Disease

## 2020-05-22 DIAGNOSIS — J441 Chronic obstructive pulmonary disease with (acute) exacerbation: Secondary | ICD-10-CM

## 2020-05-22 DIAGNOSIS — R053 Chronic cough: Secondary | ICD-10-CM | POA: Diagnosis not present

## 2020-05-22 MED ORDER — PREDNISONE 10 MG (21) PO TBPK: ORAL_TABLET | Freq: Every day | ORAL | 0 refills | Status: DC

## 2020-05-22 MED ORDER — TRELEGY ELLIPTA 100-62.5-25 MCG/INH IN AEPB: 1.0000 | INHALATION_SPRAY | Freq: Every day | RESPIRATORY_TRACT | 0 refills | Status: DC

## 2020-05-22 MED ORDER — CEFDINIR 300 MG PO CAPS: 300.0000 mg | ORAL_CAPSULE | Freq: Two times a day (BID) | ORAL | 0 refills | Status: AC

## 2020-05-22 NOTE — Addendum Note (Signed)
Addended by: Sandra Cockayne on: 05/22/2020 12:29 PM   Modules accepted: Orders

## 2020-05-22 NOTE — Patient Instructions (Signed)
Prescription for nebulizer and albuterol nebs every 6 hours as needed for wheezing # 30 We will treat you for a COPD flare Prednisone 10 mg tabs  Take 2 tabs daily with food x 5ds, then 1 tab daily with food x 5ds then STOP Omnicef 300 mg twice daily for 7 days  Okay to take Delsym OTC 5 mL twice daily as needed for cough.  Sample of Trelegy 100 once daily, start taking this after you have completed course of prednisone instead of Advair and call us back of this works for prescription  In the future we will set up PFTs

## 2020-05-22 NOTE — Progress Notes (Signed)
Subjective:    Patient ID: Desiree [name], female    DOB: 12-14-1935, 85 y.o.   MRN: [id number]  HPI  Chief Complaint  Patient presents with  . Consult    COPD for many years, cough with yellow mucous some wheezing, winded easily   85 year old week smoker presents to establish care for COPD. She smoked about 30 pack years before she quit in 2000.  She moved from [place] to [place] about 5 years ago  Was diagnosed with COPD and has been maintained on regimens of Advair, Spiriva and nebulizer with albuterol. She also reports a chronic cough for more than 20 years, she has periodic flareups which improved with antibiotics and she tolerates prednisone well. Note 2 ED visits in 2021. Chest x-ray 01/20/2020 appears clear  She is accompanied by her daughter Arline Asp, she reports sputum with yellow to green color, increased production in the mornings but all through the day.  She denies seasonal flareups. She denies recent URI or COVID infection, she is vaccinated and boosted  PMH -hypertension, colon cancer, diverticulitis 12/2019, bilateral hip replacement and kyphoplasty     Significant tests/ events reviewed CT abdomen 12/2019 no ILD or sig b-ectasis   Past Medical History:  Diagnosis Date  . Anxiety 02/13/2017  . Asthma   . B12 deficiency anemia 02/13/2017  . Chronic back pain 02/13/2017  . Chronic cough 02/13/2017  . COPD (chronic obstructive pulmonary disease) (HCC)   . Depression 02/13/2017  . Diverticulosis 02/13/2017  . Former smoker 2001  . GERD (gastroesophageal reflux disease) 02/13/2017  . Hard of hearing   . HLD (hyperlipidemia) 02/13/2017  . HTN (hypertension) 02/13/2017  . Lumbar compression fracture (HCC)   . Osteopenia 02/13/2017  . 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   Marland Kitchen Spinal stenosis 02/13/2017    Past Surgical History:  Procedure Laterality Date  .  APPENDECTOMY    . CATARACT EXTRACTION, BILATERAL    . KYPHOPLASTY  2010  . left hip surgury    . right hip surgury      Allergies  Allergen Reactions  . Doxycycline     rash  . Levaquin [Levofloxacin] Hives  . Mucinex [Guaifenesin Er] Other (See Comments)    breakout  . Percocet [Oxycodone-Acetaminophen] Hives  . Prednisone   . Azithromycin Rash    Social History   Socioeconomic History  . Marital status: Single    Spouse name: Not on file  . Number of children: Not on file  . Years of education: Not on file  . Highest education level: Not on file  Occupational History  . Not on file  Tobacco Use  . Smoking status: Former Games developer  . Smokeless tobacco: Never Used  Vaping Use  . Vaping Use: Never used  Substance and Sexual Activity  . Alcohol use: Yes  . Drug use: No  . Sexual activity: Not on file  Other Topics Concern  . Not on file  Social History Narrative  . Not on file   Social Determinants of Health   Financial Resource Strain: Not on file  Food Insecurity: Not on file  Transportation Needs: Not on file  Physical Activity: Not on file  Stress: Not on file  Social Connections: Not on file  Intimate Partner Violence: Not on file    Family History  Problem Relation Age of Onset  . Depression Father   . Lung  cancer Sister   . Lung cancer Brother   . Mesothelioma Son      Review of Systems Constitutional: negative for anorexia, fevers and sweats  Eyes: negative for irritation, redness and visual disturbance  Ears, nose, mouth, throat, and face: negative for earaches, epistaxis, nasal congestion and sore throat   Cardiovascular: negative for chest pain, dyspnea, lower extremity edema, orthopnea, palpitations and syncope  Gastrointestinal: negative for abdominal pain, constipation, diarrhea, melena, nausea and vomiting  Genitourinary:negative for dysuria, frequency and hematuria  Hematologic/lymphatic: negative for bleeding, easy bruising and  lymphadenopathy  Musculoskeletal:negative for arthralgias, muscle weakness and stiff joints  Neurological: negative for coordination problems, gait problems, headaches and weakness  Endocrine: negative for diabetic symptoms including polydipsia, polyuria and weight loss     Objective:   Physical Exam  Gen. Pleasant, elderly, well-nourished, in no distress, normal affect ENT - no pallor,icterus, no post nasal drip Neck: No JVD, no thyromegaly, no carotid bruits Lungs: no use of accessory muscles, no dullness to percussion, faint scattered  rhonchi  Cardiovascular: Rhythm regular, heart sounds  normal, no murmurs or gallops, no peripheral edema Abdomen: soft and non-tender, no hepatosplenomegaly, BS normal. Musculoskeletal: No deformities, no cyanosis or clubbing Neuro:  alert, non focal       Assessment & Plan:

## 2020-05-22 NOTE — Assessment & Plan Note (Signed)
Prescription for nebulizer and albuterol nebs every 6 hours as needed for wheezing # 30 We will treat you for a COPD flare Prednisone 10 mg tabs  Take 2 tabs daily with food x 5ds, then 1 tab daily with food x 5ds then STOP Omnicef 300 mg twice daily for 7 days  Okay to take Delsym OTC 5 mL twice daily as needed for cough.  Sample of Trelegy 100 once daily, start taking this after you have completed course of prednisone instead of Advair and call us back of this works for prescription  In the future we will set up PFTs, once current exacerbation resolved. We also discussed plan for future exacerbation

## 2020-05-22 NOTE — Assessment & Plan Note (Signed)
Attributed to chronic bronchitis, no evidence of ILD or bronchiectasis on CT abdomen. We will treat her current exacerbation She does not have other triggers such as postnasal drip, reflux or culprit medications

## 2020-05-24 ENCOUNTER — Telehealth: Payer: Self-pay | Admitting: Pulmonary Disease

## 2020-05-24 NOTE — Telephone Encounter (Signed)
Called and spoke to pt's daughter, Arline Asp (on [place]). Pt seen by Dr. Vassie Loll on 05/22/2020 and prescribed Omnicef and pred. Arline Asp states the pt began having diarrhea last night and it has continued into today. Pt has a temperature of 99.0. Arline Asp denies pt having any other new symptoms. Arline Asp is questioning if pt needs to be changed to another abx or other recs.    Dr. Vassie Loll, please advise. Thanks.

## 2020-05-24 NOTE — Telephone Encounter (Signed)
Called and spoke with patient's daughter Arline Asp. She stated that she have her mother take the [facility] once daily for 5 days. I also reminded her to make sure she is staying hydrated, especially with the diarrhea. She verbalized understanding.   Nothing further needed at time of call.

## 2020-05-24 NOTE — Telephone Encounter (Signed)
Not many options here -she is allergic to Levaquin, doxycycline and Z-Pak She can just take 1 tablet once daily for 5 days and stop -lower dose may be better tolerated

## 2020-06-08 ENCOUNTER — Other Ambulatory Visit: Payer: Self-pay | Admitting: Pulmonary Disease

## 2020-06-08 MED ORDER — ALBUTEROL SULFATE (2.5 MG/3ML) 0.083% IN NEBU: 2.5000 mg | INHALATION_SOLUTION | Freq: Four times a day (QID) | RESPIRATORY_TRACT | 12 refills | Status: AC | PRN

## 2020-06-19 ENCOUNTER — Ambulatory Visit: Payer: Medicare Other | Admitting: Adult Health

## 2020-07-11 ENCOUNTER — Other Ambulatory Visit: Payer: Self-pay | Admitting: Internal Medicine

## 2020-07-11 MED ORDER — CITALOPRAM HYDROBROMIDE 20 MG PO TABS: 20.0000 mg | ORAL_TABLET | Freq: Every day | ORAL | 1 refills | Status: AC

## 2020-07-11 MED ORDER — CITALOPRAM HYDROBROMIDE 20 MG PO TABS: 20.0000 mg | ORAL_TABLET | Freq: Every day | ORAL | 1 refills | Status: DC

## 2020-07-27 ENCOUNTER — Other Ambulatory Visit: Payer: Self-pay

## 2020-07-27 ENCOUNTER — Emergency Department (HOSPITAL_COMMUNITY)
Admission: EM | Admit: 2020-07-27 | Discharge: 2020-07-27 | Disposition: A | Discharge status: Home / Self Care | Payer: Medicare Other | Attending: Emergency Medicine | Admitting: Emergency Medicine

## 2020-07-27 ENCOUNTER — Encounter (HOSPITAL_COMMUNITY): Payer: Self-pay

## 2020-07-27 ENCOUNTER — Telehealth: Payer: Medicare Other | Admitting: Internal Medicine

## 2020-07-27 ENCOUNTER — Emergency Department (HOSPITAL_COMMUNITY): Payer: Medicare Other

## 2020-07-27 DIAGNOSIS — R0602 Shortness of breath: Secondary | ICD-10-CM | POA: Diagnosis not present

## 2020-07-27 DIAGNOSIS — E161 Other hypoglycemia: Secondary | ICD-10-CM | POA: Diagnosis not present

## 2020-07-27 DIAGNOSIS — R059 Cough, unspecified: Secondary | ICD-10-CM | POA: Diagnosis not present

## 2020-07-27 DIAGNOSIS — G4489 Other headache syndrome: Secondary | ICD-10-CM | POA: Diagnosis not present

## 2020-07-27 DIAGNOSIS — R531 Weakness: Secondary | ICD-10-CM | POA: Diagnosis not present

## 2020-07-27 DIAGNOSIS — J45909 Unspecified asthma, uncomplicated: Secondary | ICD-10-CM | POA: Diagnosis not present

## 2020-07-27 DIAGNOSIS — Z87891 Personal history of nicotine dependence: Secondary | ICD-10-CM | POA: Insufficient documentation

## 2020-07-27 DIAGNOSIS — Z20822 Contact with and (suspected) exposure to covid-19: Secondary | ICD-10-CM | POA: Insufficient documentation

## 2020-07-27 DIAGNOSIS — E162 Hypoglycemia, unspecified: Secondary | ICD-10-CM | POA: Diagnosis not present

## 2020-07-27 DIAGNOSIS — J441 Chronic obstructive pulmonary disease with (acute) exacerbation: Secondary | ICD-10-CM | POA: Diagnosis not present

## 2020-07-27 DIAGNOSIS — R0902 Hypoxemia: Secondary | ICD-10-CM | POA: Diagnosis not present

## 2020-07-27 DIAGNOSIS — Z20828 Contact with and (suspected) exposure to other viral communicable diseases: Secondary | ICD-10-CM | POA: Diagnosis not present

## 2020-07-27 DIAGNOSIS — Z79899 Other long term (current) drug therapy: Secondary | ICD-10-CM | POA: Diagnosis not present

## 2020-07-27 DIAGNOSIS — J40 Bronchitis, not specified as acute or chronic: Secondary | ICD-10-CM | POA: Insufficient documentation

## 2020-07-27 DIAGNOSIS — Z7952 Long term (current) use of systemic steroids: Secondary | ICD-10-CM | POA: Insufficient documentation

## 2020-07-27 LAB — CBC WITH DIFFERENTIAL/PLATELET
Abs Immature Granulocytes: 0.05 10*3/uL (ref 0.00–0.07)
Basophils Absolute: 0 10*3/uL (ref 0.0–0.1)
Basophils Relative: 0 %
Eosinophils Absolute: 0 10*3/uL (ref 0.0–0.5)
Eosinophils Relative: 0 %
HCT: 39.2 % (ref 36.0–46.0)
Hemoglobin: 13.1 g/dL (ref 12.0–15.0)
Immature Granulocytes: 1 %
Lymphocytes Relative: 13 %
Lymphs Abs: 1.3 10*3/uL (ref 0.7–4.0)
MCH: 30.1 pg (ref 26.0–34.0)
MCHC: 33.4 g/dL (ref 30.0–36.0)
MCV: 90.1 fL (ref 80.0–100.0)
Monocytes Absolute: 1.2 10*3/uL — ABNORMAL HIGH (ref 0.1–1.0)
Monocytes Relative: 12 %
Neutro Abs: 7.2 10*3/uL (ref 1.7–7.7)
Neutrophils Relative %: 74 %
Platelets: 260 10*3/uL (ref 150–400)
RBC: 4.35 MIL/uL (ref 3.87–5.11)
RDW: 13.2 % (ref 11.5–15.5)
WBC: 9.7 10*3/uL (ref 4.0–10.5)
nRBC: 0 % (ref 0.0–0.2)

## 2020-07-27 LAB — COMPREHENSIVE METABOLIC PANEL
ALT: 15 U/L (ref 0–44)
AST: 16 U/L (ref 15–41)
Albumin: 3.4 g/dL — ABNORMAL LOW (ref 3.5–5.0)
Alkaline Phosphatase: 47 U/L (ref 38–126)
Anion gap: 10 (ref 5–15)
BUN: 21 mg/dL (ref 8–23)
CO2: 27 mmol/L (ref 22–32)
Calcium: 8.4 mg/dL — ABNORMAL LOW (ref 8.9–10.3)
Chloride: 101 mmol/L (ref 98–111)
Creatinine, Ser: 0.95 mg/dL (ref 0.44–1.00)
GFR, Estimated: 59 mL/min — ABNORMAL LOW (ref >60)
Glucose, Bld: 121 mg/dL — ABNORMAL HIGH (ref 70–99)
Potassium: 3.3 mmol/L — ABNORMAL LOW (ref 3.5–5.1)
Sodium: 138 mmol/L (ref 135–145)
Total Bilirubin: 0.8 mg/dL (ref 0.3–1.2)
Total Protein: 7 g/dL (ref 6.5–8.1)

## 2020-07-27 LAB — RESP PANEL BY RT-PCR (FLU A&B, COVID) ARPGX2
Influenza A by PCR: NEGATIVE
Influenza B by PCR: NEGATIVE
SARS Coronavirus 2 by RT PCR: NEGATIVE

## 2020-07-27 MED ORDER — PREDNISONE 10 MG (21) PO TBPK: ORAL_TABLET | Freq: Every day | ORAL | 0 refills | Status: AC

## 2020-07-27 MED ORDER — AMOXICILLIN 500 MG PO CAPS: 500.0000 mg | ORAL_CAPSULE | Freq: Three times a day (TID) | ORAL | 0 refills | Status: AC

## 2020-07-27 MED ORDER — PREDNISONE 10 MG (21) PO TBPK: ORAL_TABLET | Freq: Every day | ORAL | 0 refills | Status: DC

## 2020-07-27 MED ORDER — AMOXICILLIN 500 MG PO CAPS: 500.0000 mg | ORAL_CAPSULE | Freq: Three times a day (TID) | ORAL | 0 refills | Status: DC

## 2020-07-27 NOTE — ED Triage Notes (Signed)
[facility]- cough, chills, weakness and n/v since Wednesday. Pt states she is vaccinated, afebrile on arrival, A&O X4. Given 4mg  zofran en route by EMS  98.3 144/70 76 Cbg 129, 98% ra

## 2020-07-27 NOTE — ED Provider Notes (Signed)
[facility]-EMERGENCY DEPT Provider Note   CSN: [id number] Arrival date & time: 07/27/20  1109     History Chief Complaint  Patient presents with   Cough   Weakness    Desiree [name] is a 85 y.o. female.  Patient with bad COPD.  She complains of cough and yellow sputum production.  She gets pneumonia relatively quickly.  The history is provided by the patient and medical records. No language interpreter was used.  Cough Cough characteristics:  Productive Sputum characteristics:  Yellow Severity:  Moderate Onset quality:  Sudden Duration: 2 days. Timing:  Constant Progression:  Worsening Chronicity:  New Smoker: no   Context: not animal exposure   Relieved by:  Nothing Worsened by:  Nothing Associated symptoms: no chest pain, no eye discharge, no headaches and no rash   Weakness Associated symptoms: cough   Associated symptoms: no abdominal pain, no chest pain, no diarrhea, no frequency, no headaches and no seizures       Past Medical History:  Diagnosis Date   Anxiety 02/13/2017   Asthma    B12 deficiency anemia 02/13/2017   Chronic back pain 02/13/2017   Chronic cough 02/13/2017   COPD (chronic obstructive pulmonary disease) (HCC)    Depression 02/13/2017   Diverticulosis 02/13/2017   Former smoker 2001   GERD (gastroesophageal reflux disease) 02/13/2017   Hard of hearing    HLD (hyperlipidemia) 02/13/2017   HTN (hypertension) 02/13/2017   Lumbar compression fracture (HCC)    Osteopenia 02/13/2017   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   Diarrhea 03/18/2019   Lower abdominal pain 03/18/2019   AKI (acute kidney  injury) (HCC) 03/18/2019   Degenerative arthritis of right knee 12/23/2018   Baker's cyst of knee, right 12/23/2018   Right knee pain 12/22/2018   Insomnia 07/04/2018   B12 deficiency 07/04/2018   Iron deficiency 07/03/2018   Wheezing 02/26/2018   Allergic rhinitis 12/25/2017   COPD exacerbation (HCC) 02/13/2017   Encounter for well adult exam with abnormal findings 02/13/2017   Hyperglycemia 02/13/2017   HLD (hyperlipidemia) 02/13/2017   B12 deficiency anemia 02/13/2017   Chronic back pain 02/13/2017   Depression 02/13/2017   Diverticulosis 02/13/2017   Anxiety 02/13/2017   Chronic cough 02/13/2017   Spinal stenosis 02/13/2017   Osteopenia 02/13/2017   GERD (gastroesophageal reflux disease) 02/13/2017   HTN (hypertension) 02/13/2017   COPD (chronic obstructive pulmonary disease) (HCC)    Asthma    Lumbar compression fracture (HCC)     Past Surgical History:  Procedure Laterality Date   APPENDECTOMY     CATARACT EXTRACTION, BILATERAL     KYPHOPLASTY  2010   left hip surgury     right hip surgury       OB History   No obstetric history on file.     Family History  Problem Relation Age of Onset   Depression Father    Lung cancer Sister    Lung cancer Brother    Mesothelioma Son     Social History   Tobacco Use   Smoking status: Former    Pack years: 0.00   Smokeless  tobacco: Never  Vaping Use   Vaping Use: Never used  Substance Use Topics   Alcohol use: Not Currently   Drug use: No    Home Medications Prior to Admission medications   Medication Sig Start Date End Date Taking? Authorizing Provider  amoxicillin (AMOXIL) 500 MG capsule Take 1 capsule (500 mg total) by mouth 3 (three) times daily. 07/27/20  Yes Bethann Berkshire, MD  predniSONE (STERAPRED UNI-PAK 21 TAB) 10 MG (21) TBPK tablet Take by mouth daily. Take 6 tabs by mouth daily  for 2 days, then 5 tabs for 2 days, then 4 tabs for 2 days, then 3 tabs for 2 days, 2 tabs for 2 days, then 1 tab by  mouth daily for 2 days 07/27/20  Yes Bethann Berkshire, MD  albuterol (PROVENTIL) (2.5 MG/3ML) 0.083% nebulizer solution Take 3 mLs (2.5 mg total) by nebulization every 6 (six) hours as needed for wheezing or shortness of breath. 06/08/20   Oretha Milch, MD  cefdinir (OMNICEF) 300 MG capsule Take 1 capsule (300 mg total) by mouth every 12 (twelve) hours. 01/21/20   Rhetta Mura, MD  cefdinir (OMNICEF) 300 MG capsule Take 1 capsule (300 mg total) by mouth 2 (two) times daily. 05/22/20   Oretha Milch, MD  chlorpheniramine-HYDROcodone (TUSSIONEX) 10-8 MG/5ML SUER Take 5 mLs by mouth every 12 (twelve) hours. 01/21/20   Rhetta Mura, MD  citalopram (CELEXA) 20 MG tablet Take 1 tablet (20 mg total) by mouth daily. 07/11/20   Corwin Levins, MD  Fluticasone-Salmeterol (ADVAIR DISKUS) 250-50 MCG/DOSE AEPB Inhale 1 puff into the lungs 2 (two) times daily. 05/05/19 05/04/20  Corwin Levins, MD  Fluticasone-Umeclidin-Vilant (TRELEGY ELLIPTA) 100-62.5-25 MCG/INH AEPB Inhale 1 puff into the lungs daily. 05/22/20   Oretha Milch, MD  gabapentin (NEURONTIN) 100 MG capsule Take 1 capsule (100 mg total) by mouth at bedtime. 04/27/20   Rodolph Bong, MD  hydrochlorothiazide (HYDRODIURIL) 25 MG tablet Take 1 tablet by mouth daily. 02/20/20   [provider]  iron polysaccharides (NU-IRON) 150 MG capsule Take 1 capsule (150 mg total) by mouth daily. Patient taking differently: Take 150 mg by mouth See admin instructions. Takes 2 times a week 07/05/18   Corwin Levins, MD  losartan (COZAAR) 100 MG tablet  05/25/19   [provider]  Melatonin 10 MG TABS Take 10 mg by mouth daily as needed.    [provider]  metroNIDAZOLE (FLAGYL) 500 MG tablet Take 1 tablet (500 mg total) by mouth 3 (three) times daily. 01/21/20   Rhetta Mura, MD  montelukast (SINGULAIR) 10 MG tablet TAKE 1 TABLET AT BEDTIME 04/30/20   Corwin Levins, MD  ondansetron (ZOFRAN ODT) 4 MG disintegrating tablet Take 1 tablet (4 mg  total) by mouth every 8 (eight) hours as needed for nausea or vomiting. 12/29/19   Terressa Koyanagi, DO  pantoprazole (PROTONIX) 40 MG tablet Take 1 tablet (40 mg total) by mouth daily. 01/30/20   Corwin Levins, MD  potassium chloride SA (KLOR-CON) 20 MEQ tablet Take 2 tablets (40 mEq total) by mouth 2 (two) times daily. 01/21/20   Rhetta Mura, MD    Allergies    Doxycycline, Levaquin [levofloxacin], Mucinex [guaifenesin er], Percocet [oxycodone-acetaminophen], Prednisone, and Azithromycin  Review of Systems   Review of Systems  Constitutional:  Negative for appetite change and fatigue.  HENT:  Negative for congestion, ear discharge and sinus pressure.   Eyes:  Negative for discharge.  Respiratory:  Positive  for cough.   Cardiovascular:  Negative for chest pain.  Gastrointestinal:  Negative for abdominal pain and diarrhea.  Genitourinary:  Negative for frequency and hematuria.  Musculoskeletal:  Negative for back pain.  Skin:  Negative for rash.  Neurological:  Positive for weakness. Negative for seizures and headaches.  Psychiatric/Behavioral:  Negative for hallucinations.    Physical Exam Updated Vital Signs BP 112/71 (BP Location: Left Arm)   Pulse 68   Temp 98.9 F (37.2 C) (Oral)   Resp 18   Ht 5\' 3"  (1.6 m)   Wt 69.4 kg   SpO2 96%   BMI 27.10 kg/m   Physical Exam Vitals and nursing note reviewed.  Constitutional:      Appearance: She is well-developed.  HENT:     Head: Normocephalic.     Nose: Nose normal.  Eyes:     General: No scleral icterus.    Conjunctiva/sclera: Conjunctivae normal.  Neck:     Thyroid: No thyromegaly.  Cardiovascular:     Rate and Rhythm: Normal rate and regular rhythm.     Heart sounds: No murmur heard.   No friction rub. No gallop.  Pulmonary:     Breath sounds: No stridor. No wheezing or rales.     Comments: Rales throughout Chest:     Chest wall: No tenderness.  Abdominal:     General: There is no distension.     Tenderness:  There is no abdominal tenderness. There is no rebound.  Musculoskeletal:        General: Normal range of motion.     Cervical back: Neck supple.  Lymphadenopathy:     Cervical: No cervical adenopathy.  Skin:    Findings: No erythema or rash.  Neurological:     Mental Status: She is oriented to person, place, and time.     Motor: No abnormal muscle tone.     Coordination: Coordination normal.  Psychiatric:        Behavior: Behavior normal.    ED Results / Procedures / Treatments   Labs (all labs ordered are listed, but only abnormal results are displayed) Labs Reviewed  CBC WITH DIFFERENTIAL/PLATELET - Abnormal; Notable for the following components:      Result Value   Monocytes Absolute 1.2 (*)    All other components within normal limits  COMPREHENSIVE METABOLIC PANEL - Abnormal; Notable for the following components:   Potassium 3.3 (*)    Glucose, Bld 121 (*)    Calcium 8.4 (*)    Albumin 3.4 (*)    GFR, Estimated 59 (*)    All other components within normal limits  RESP PANEL BY RT-PCR (FLU A&B, COVID) [id number]    EKG None  Radiology DG Chest Port 1 View  Result Date: 07/27/2020 CLINICAL DATA:  Shortness of breath. EXAM: PORTABLE CHEST 1 VIEW COMPARISON:  January 20, 2020. FINDINGS: The heart size and mediastinal contours are within normal limits. Both lungs are clear. The visualized skeletal structures are unremarkable. IMPRESSION: No active disease. Electronically Signed   By: Lupita RaiderJames  Green Jr M.D.   On: 07/27/2020 13:16    Procedures Procedures   Medications Ordered in ED Medications - No data to display  ED Course  I have reviewed the triage vital signs and the nursing notes.  Pertinent labs & imaging results that were available during my care of the patient were reviewed by me and considered in my medical decision making (see chart for details).    MDM Rules/Calculators/A&P  Patient with exacerbation of COPD and bronchitis.  She is  placed on amoxicillin and prednisone will follow up Final Clinical Impression(s) / ED Diagnoses Final diagnoses:  Cough    Rx / DC Orders ED Discharge Orders          Ordered    amoxicillin (AMOXIL) 500 MG capsule  3 times daily        07/27/20 1451    predniSONE (STERAPRED UNI-PAK 21 TAB) 10 MG (21) TBPK tablet  Daily        07/27/20 1451             Bethann Berkshire, MD 07/27/20 1455

## 2020-07-27 NOTE — ED Notes (Signed)
Provider at bedside

## 2020-07-27 NOTE — ED Notes (Signed)
Pt being discharged, rx sent to pharmacy, daughter at bedside and taking pt home. Assisted pt into wheelchair and brought pt out to daughter's car.

## 2020-07-27 NOTE — ED Notes (Signed)
Daughter requesting update, provider made aware

## 2020-07-27 NOTE — ED Notes (Signed)
Brought pt ice chips

## 2020-07-27 NOTE — Discharge Instructions (Addendum)
Follow-up with your lung doctor next week if not improving

## 2020-08-15 DIAGNOSIS — Z23 Encounter for immunization: Secondary | ICD-10-CM | POA: Diagnosis not present

## 2020-10-19 ENCOUNTER — Other Ambulatory Visit: Payer: Self-pay

## 2020-10-21 MED ORDER — TRELEGY ELLIPTA 100-62.5-25 MCG/INH IN AEPB: 1.0000 | INHALATION_SPRAY | Freq: Every day | RESPIRATORY_TRACT | 4 refills | Status: AC

## 2020-11-14 ENCOUNTER — Other Ambulatory Visit: Payer: Self-pay

## 2020-12-26 DIAGNOSIS — R053 Chronic cough: Secondary | ICD-10-CM | POA: Diagnosis not present

## 2020-12-26 DIAGNOSIS — Z7689 Persons encountering health services in other specified circumstances: Secondary | ICD-10-CM | POA: Diagnosis not present

## 2020-12-26 DIAGNOSIS — K2970 Gastritis, unspecified, without bleeding: Secondary | ICD-10-CM | POA: Diagnosis not present

## 2020-12-27 DIAGNOSIS — Z79899 Other long term (current) drug therapy: Secondary | ICD-10-CM | POA: Diagnosis not present

## 2020-12-27 DIAGNOSIS — R5383 Other fatigue: Secondary | ICD-10-CM | POA: Diagnosis not present

## 2021-01-20 DIAGNOSIS — Z20822 Contact with and (suspected) exposure to covid-19: Secondary | ICD-10-CM | POA: Diagnosis not present

## 2021-04-04 DIAGNOSIS — Z20822 Contact with and (suspected) exposure to covid-19: Secondary | ICD-10-CM | POA: Diagnosis not present

## 2021-04-05 DIAGNOSIS — J45998 Other asthma: Secondary | ICD-10-CM | POA: Diagnosis not present

## 2021-04-05 DIAGNOSIS — J449 Chronic obstructive pulmonary disease, unspecified: Secondary | ICD-10-CM | POA: Diagnosis not present

## 2021-04-05 DIAGNOSIS — Z7689 Persons encountering health services in other specified circumstances: Secondary | ICD-10-CM | POA: Diagnosis not present

## 2021-04-24 DIAGNOSIS — S90812A Abrasion, left foot, initial encounter: Secondary | ICD-10-CM | POA: Diagnosis not present

## 2021-04-24 DIAGNOSIS — K219 Gastro-esophageal reflux disease without esophagitis: Secondary | ICD-10-CM | POA: Diagnosis not present

## 2021-04-26 DIAGNOSIS — Z7689 Persons encountering health services in other specified circumstances: Secondary | ICD-10-CM | POA: Diagnosis not present

## 2021-04-26 DIAGNOSIS — J449 Chronic obstructive pulmonary disease, unspecified: Secondary | ICD-10-CM | POA: Diagnosis not present

## 2021-05-01 DIAGNOSIS — J984 Other disorders of lung: Secondary | ICD-10-CM | POA: Diagnosis not present

## 2021-05-01 DIAGNOSIS — J8410 Pulmonary fibrosis, unspecified: Secondary | ICD-10-CM | POA: Diagnosis not present

## 2021-05-09 DIAGNOSIS — I6523 Occlusion and stenosis of bilateral carotid arteries: Secondary | ICD-10-CM | POA: Diagnosis not present

## 2021-05-09 DIAGNOSIS — I619 Nontraumatic intracerebral hemorrhage, unspecified: Secondary | ICD-10-CM | POA: Diagnosis not present

## 2021-05-09 DIAGNOSIS — I609 Nontraumatic subarachnoid hemorrhage, unspecified: Secondary | ICD-10-CM | POA: Diagnosis not present

## 2021-05-09 DIAGNOSIS — Z743 Need for continuous supervision: Secondary | ICD-10-CM | POA: Diagnosis not present

## 2021-05-09 DIAGNOSIS — E876 Hypokalemia: Secondary | ICD-10-CM | POA: Diagnosis not present

## 2021-05-09 DIAGNOSIS — R9089 Other abnormal findings on diagnostic imaging of central nervous system: Secondary | ICD-10-CM | POA: Diagnosis not present

## 2021-05-09 DIAGNOSIS — H5711 Ocular pain, right eye: Secondary | ICD-10-CM | POA: Diagnosis not present

## 2021-05-09 DIAGNOSIS — G936 Cerebral edema: Secondary | ICD-10-CM | POA: Diagnosis not present

## 2021-05-09 DIAGNOSIS — R29810 Facial weakness: Secondary | ICD-10-CM | POA: Diagnosis not present

## 2021-05-09 DIAGNOSIS — F32A Depression, unspecified: Secondary | ICD-10-CM | POA: Diagnosis not present

## 2021-05-09 DIAGNOSIS — Z28311 Partially vaccinated for covid-19: Secondary | ICD-10-CM | POA: Diagnosis not present

## 2021-05-09 DIAGNOSIS — G9340 Encephalopathy, unspecified: Secondary | ICD-10-CM | POA: Diagnosis not present

## 2021-05-09 DIAGNOSIS — J449 Chronic obstructive pulmonary disease, unspecified: Secondary | ICD-10-CM | POA: Diagnosis not present

## 2021-05-09 DIAGNOSIS — J309 Allergic rhinitis, unspecified: Secondary | ICD-10-CM | POA: Diagnosis not present

## 2021-05-09 DIAGNOSIS — Z66 Do not resuscitate: Secondary | ICD-10-CM | POA: Diagnosis not present

## 2021-05-09 DIAGNOSIS — R5381 Other malaise: Secondary | ICD-10-CM | POA: Diagnosis not present

## 2021-05-09 DIAGNOSIS — R0602 Shortness of breath: Secondary | ICD-10-CM | POA: Diagnosis not present

## 2021-05-09 DIAGNOSIS — R519 Headache, unspecified: Secondary | ICD-10-CM | POA: Diagnosis not present

## 2021-05-09 DIAGNOSIS — K5900 Constipation, unspecified: Secondary | ICD-10-CM | POA: Diagnosis not present

## 2021-05-09 DIAGNOSIS — F419 Anxiety disorder, unspecified: Secondary | ICD-10-CM | POA: Diagnosis not present

## 2021-05-09 DIAGNOSIS — R030 Elevated blood-pressure reading, without diagnosis of hypertension: Secondary | ICD-10-CM | POA: Diagnosis not present

## 2021-05-09 DIAGNOSIS — Z808 Family history of malignant neoplasm of other organs or systems: Secondary | ICD-10-CM | POA: Diagnosis not present

## 2021-05-09 DIAGNOSIS — I611 Nontraumatic intracerebral hemorrhage in hemisphere, cortical: Secondary | ICD-10-CM | POA: Diagnosis not present

## 2021-05-09 DIAGNOSIS — Z515 Encounter for palliative care: Secondary | ICD-10-CM | POA: Diagnosis not present

## 2021-05-09 DIAGNOSIS — R930 Abnormal findings on diagnostic imaging of skull and head, not elsewhere classified: Secondary | ICD-10-CM | POA: Diagnosis not present

## 2021-05-09 DIAGNOSIS — I700 Atherosclerosis of aorta: Secondary | ICD-10-CM | POA: Diagnosis not present

## 2021-05-09 DIAGNOSIS — I629 Nontraumatic intracranial hemorrhage, unspecified: Secondary | ICD-10-CM | POA: Diagnosis not present

## 2021-05-09 DIAGNOSIS — I639 Cerebral infarction, unspecified: Secondary | ICD-10-CM | POA: Diagnosis not present

## 2021-05-09 DIAGNOSIS — G8104 Flaccid hemiplegia affecting left nondominant side: Secondary | ICD-10-CM | POA: Diagnosis not present

## 2021-05-09 DIAGNOSIS — G8220 Paraplegia, unspecified: Secondary | ICD-10-CM | POA: Diagnosis not present

## 2021-05-09 DIAGNOSIS — R29703 NIHSS score 3: Secondary | ICD-10-CM | POA: Diagnosis not present

## 2021-05-09 DIAGNOSIS — I618 Other nontraumatic intracerebral hemorrhage: Secondary | ICD-10-CM | POA: Diagnosis not present

## 2021-05-09 DIAGNOSIS — I161 Hypertensive emergency: Secondary | ICD-10-CM | POA: Diagnosis not present

## 2021-05-09 DIAGNOSIS — R52 Pain, unspecified: Secondary | ICD-10-CM | POA: Diagnosis not present

## 2021-05-09 DIAGNOSIS — R1312 Dysphagia, oropharyngeal phase: Secondary | ICD-10-CM | POA: Diagnosis not present

## 2021-05-09 DIAGNOSIS — I63233 Cerebral infarction due to unspecified occlusion or stenosis of bilateral carotid arteries: Secondary | ICD-10-CM | POA: Diagnosis not present

## 2021-05-09 DIAGNOSIS — R9431 Abnormal electrocardiogram [ECG] [EKG]: Secondary | ICD-10-CM | POA: Diagnosis not present

## 2021-05-09 DIAGNOSIS — Z87891 Personal history of nicotine dependence: Secondary | ICD-10-CM | POA: Diagnosis not present

## 2021-05-09 DIAGNOSIS — R402411 Glasgow coma scale score 13-15, in the field [EMT or ambulance]: Secondary | ICD-10-CM | POA: Diagnosis not present

## 2021-05-09 DIAGNOSIS — I160 Hypertensive urgency: Secondary | ICD-10-CM | POA: Diagnosis not present

## 2021-05-09 DIAGNOSIS — R110 Nausea: Secondary | ICD-10-CM | POA: Diagnosis not present

## 2021-05-20 DEATH — deceased

## 2021-05-24 DIAGNOSIS — Z20822 Contact with and (suspected) exposure to covid-19: Secondary | ICD-10-CM | POA: Diagnosis not present

## 2021-10-11 IMAGING — CT CT ABD-PELV W/O CM
2 of 4 series · 16 of 46 positions shown, 18 images · non-contrast
Comparison: None.

CLINICAL DATA: Left lower quadrant abdominal pain

EXAM:
CT ABDOMEN AND PELVIS WITHOUT CONTRAST
TECHNIQUE: Multidetector CT imaging of the abdomen and pelvis was performed
following the standard protocol without IV contrast.

[Series 2: axial st · axial · 0.72mm/px · z∈[-487,-127]mm · 13 of 80 slices shown, 15 images]
[im 4/80  soft-tissue]
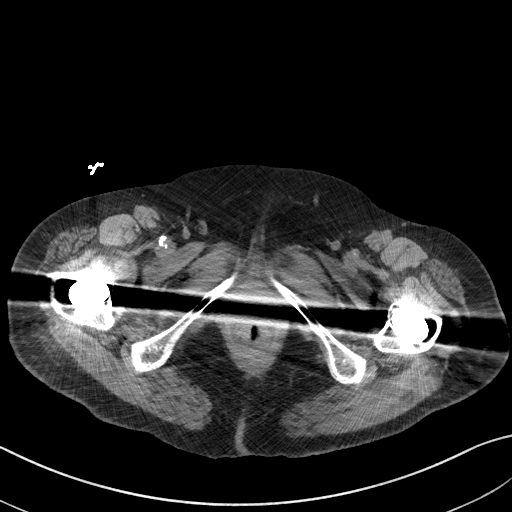
[im 4/80  bone]
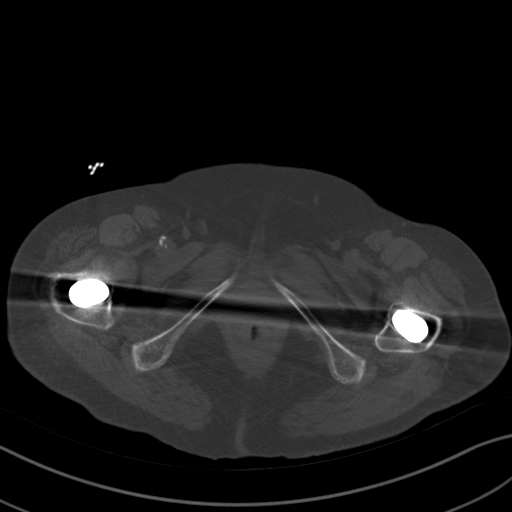
[im 10/80  soft-tissue]
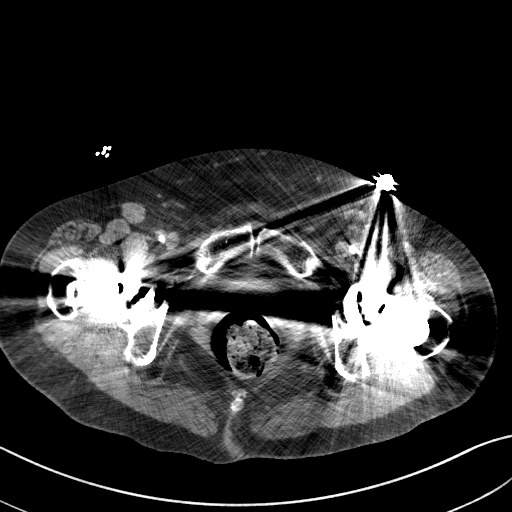
[im 17/80  soft-tissue]
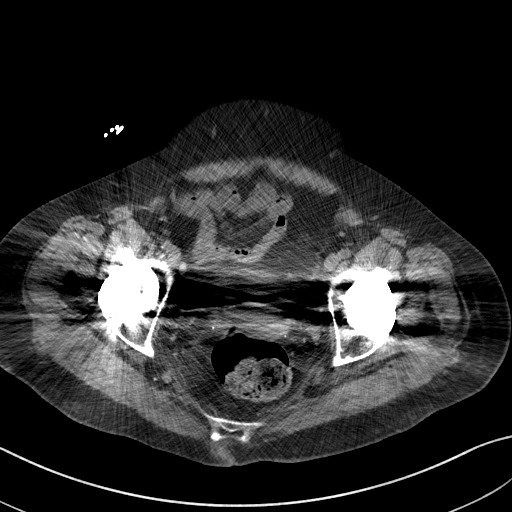
[im 24/80  soft-tissue]
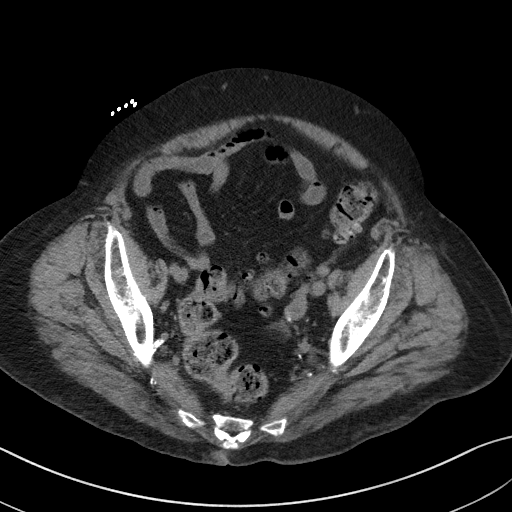
[im 27/80  soft-tissue]
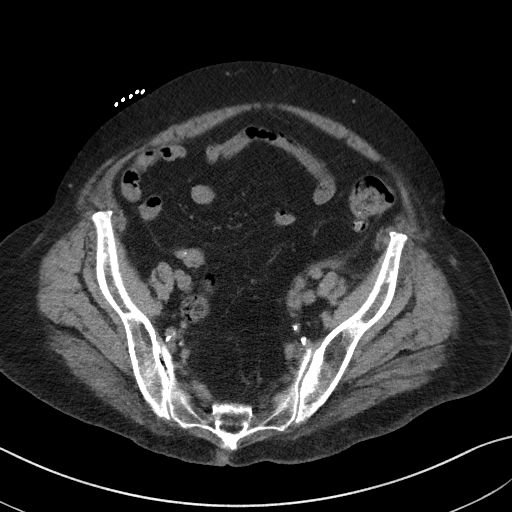
[im 33/80  soft-tissue]
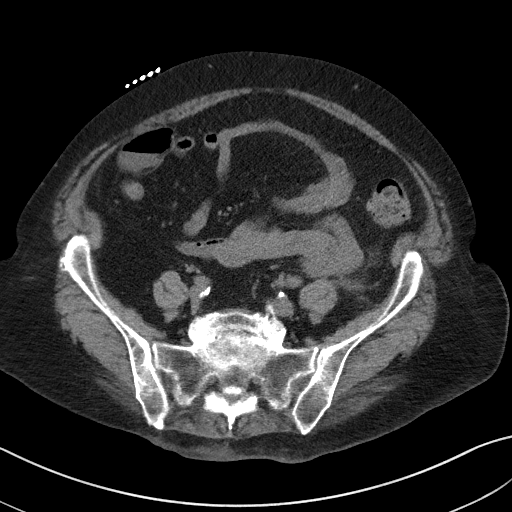
[im 40/80  soft-tissue]
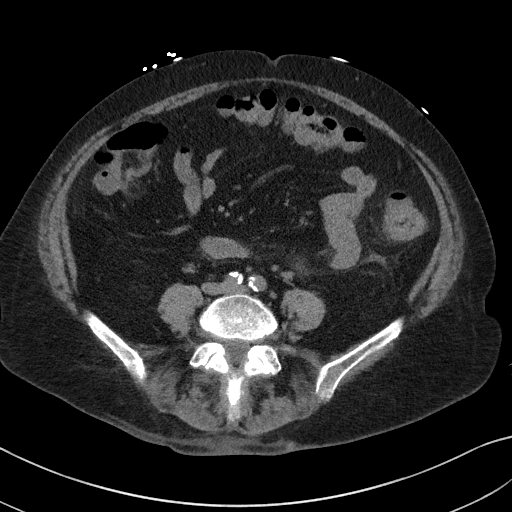
[im 47/80  soft-tissue]
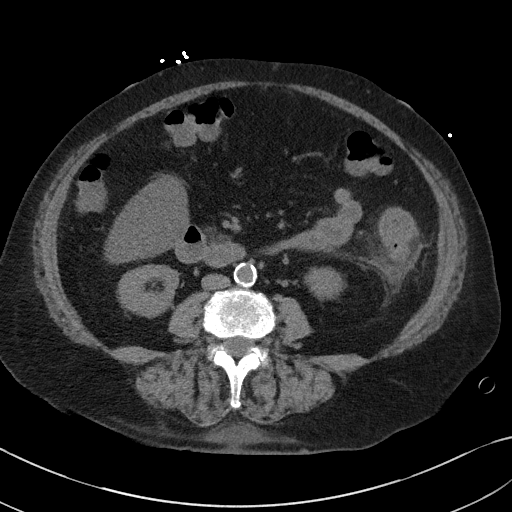
[im 53/80  soft-tissue]
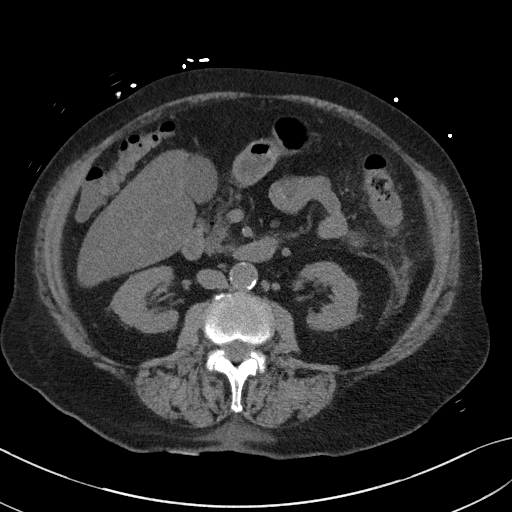
[im 53/80  bone]
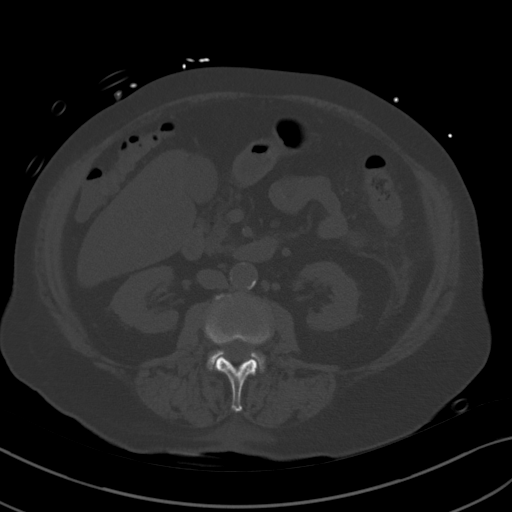
[im 56/80  soft-tissue]
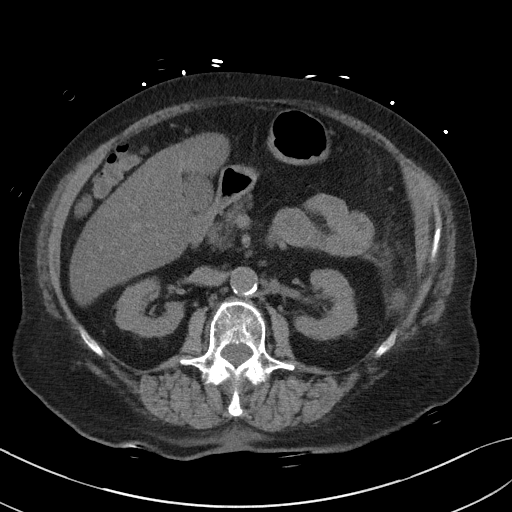
[im 63/80  soft-tissue]
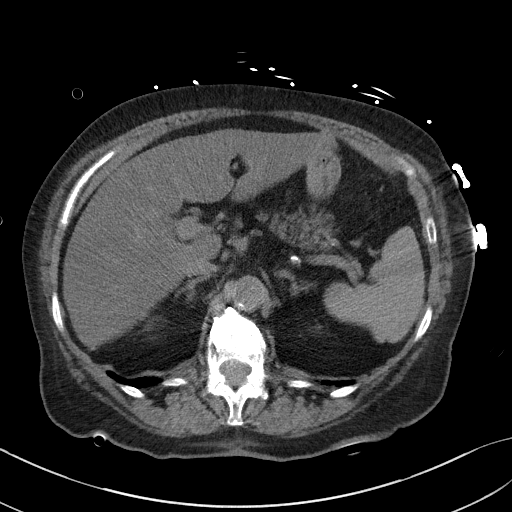
[im 70/80  soft-tissue]
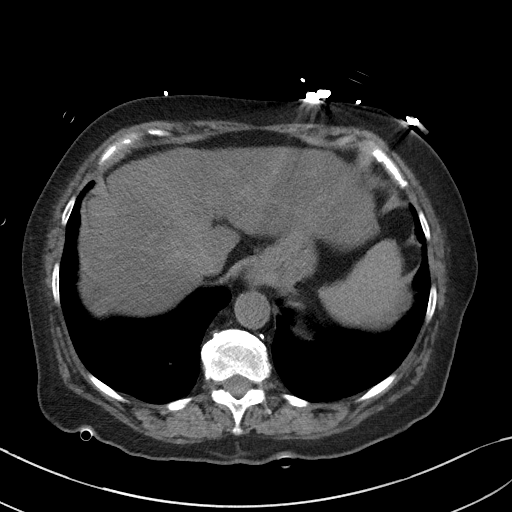
[im 76/80  soft-tissue]
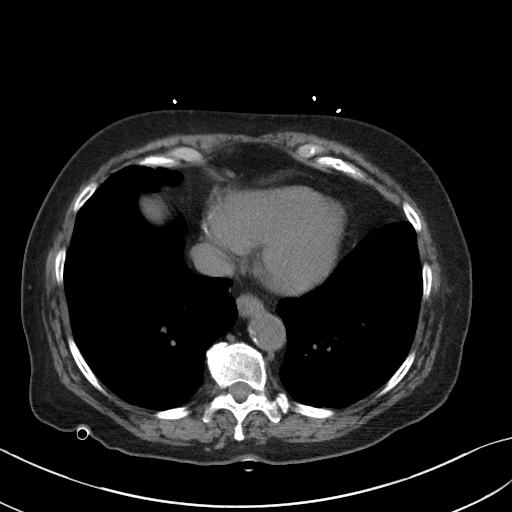

[Series 5: coronal st · coronal · 0.75mm/px · 3 of 87 slices shown]
[im 29/87  soft-tissue]
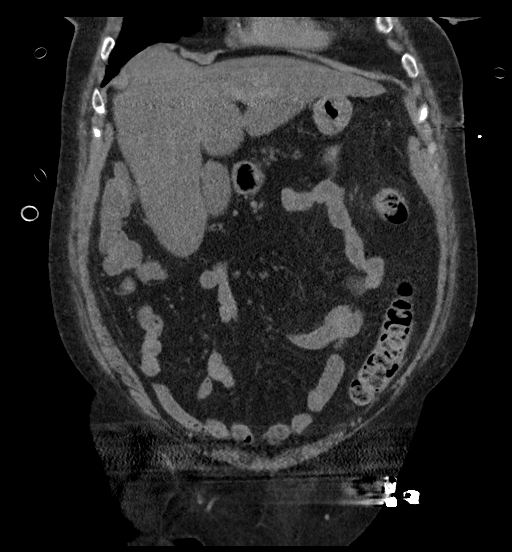
[im 39/87  soft-tissue]
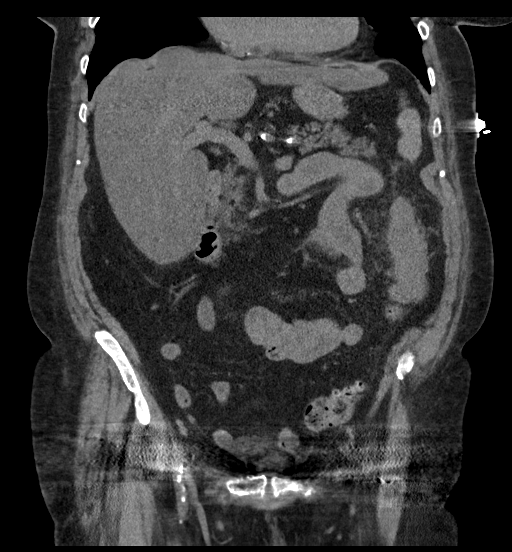
[im 48/87  soft-tissue]
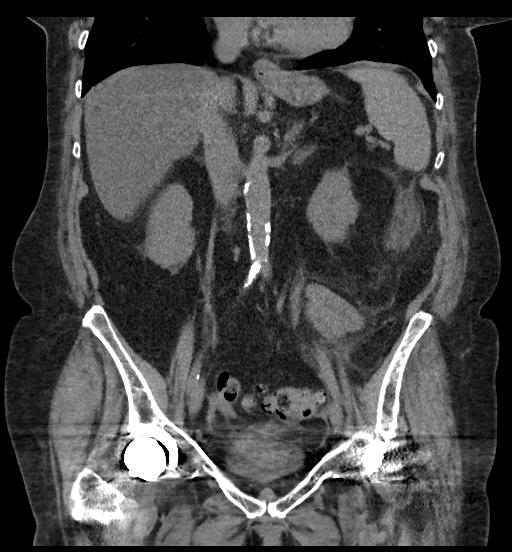

[16 of 46 positions shown; findings below may reference images not displayed]

FINDINGS: Lower chest: The visualized heart size within normal limits. No
pericardial fluid/thickening.

No hiatal hernia.

The visualized portions of the lungs are clear.

Hepatobiliary: There is diffuse low density seen throughout the
liver parenchyma. No evidence of calcified gallstones or biliary
ductal dilatation.

Pancreas:  Unremarkable.  No surrounding inflammatory changes.

Spleen: Normal in size. Although limited due to the lack of
intravenous contrast, normal in appearance.

Adrenals/Urinary Tract: Both adrenal glands appear normal. The
kidneys and collecting system appear normal without evidence of
urinary tract calculus or hydronephrosis. Bladder is unremarkable.

Stomach/Bowel: The stomach, small bowel, are grossly unremarkable.
There is a focal segment of descending colon measuring 9 cm in
length with diffuse wall thickening scattered colonic diverticula
significant surrounding fat stranding changes. No free air or
loculated fluid collections however are noted. Stranding changes
extend into the left pericolic gutter.

Vascular/Lymphatic: There are no enlarged abdominal or pelvic lymph
nodes. Scattered aortic atherosclerotic calcifications are seen
without aneurysmal dilatation.

Reproductive: The uterus and adnexa are unremarkable.

Other: A small amount of free fluid is seen within the deep pelvis.

Musculoskeletal: No acute or significant osseous findings. Bilateral
total hip arthroplasties are noted. Chronic bilateral pars defects
are seen at L5 with a grade 1 anterolisthesis. Cement fixation noted
within T12 and L1 vertebral bodies.
IMPRESSION: Findings consistent with acute descending colon diverticulitis with
significant surrounding inflammatory changes. No loculated fluid
collection or free air.

Hepatic steatosis

Aortic Atherosclerosis ([id number]-BIG.G).

## 2022-04-22 IMAGING — DX DG CHEST 1V PORT
1 series · 1 of 1 positions shown · non-contrast
Comparison: January 20, 2020.

CLINICAL DATA: Shortness of breath.

EXAM:
PORTABLE CHEST 1 VIEW

[chest ap]
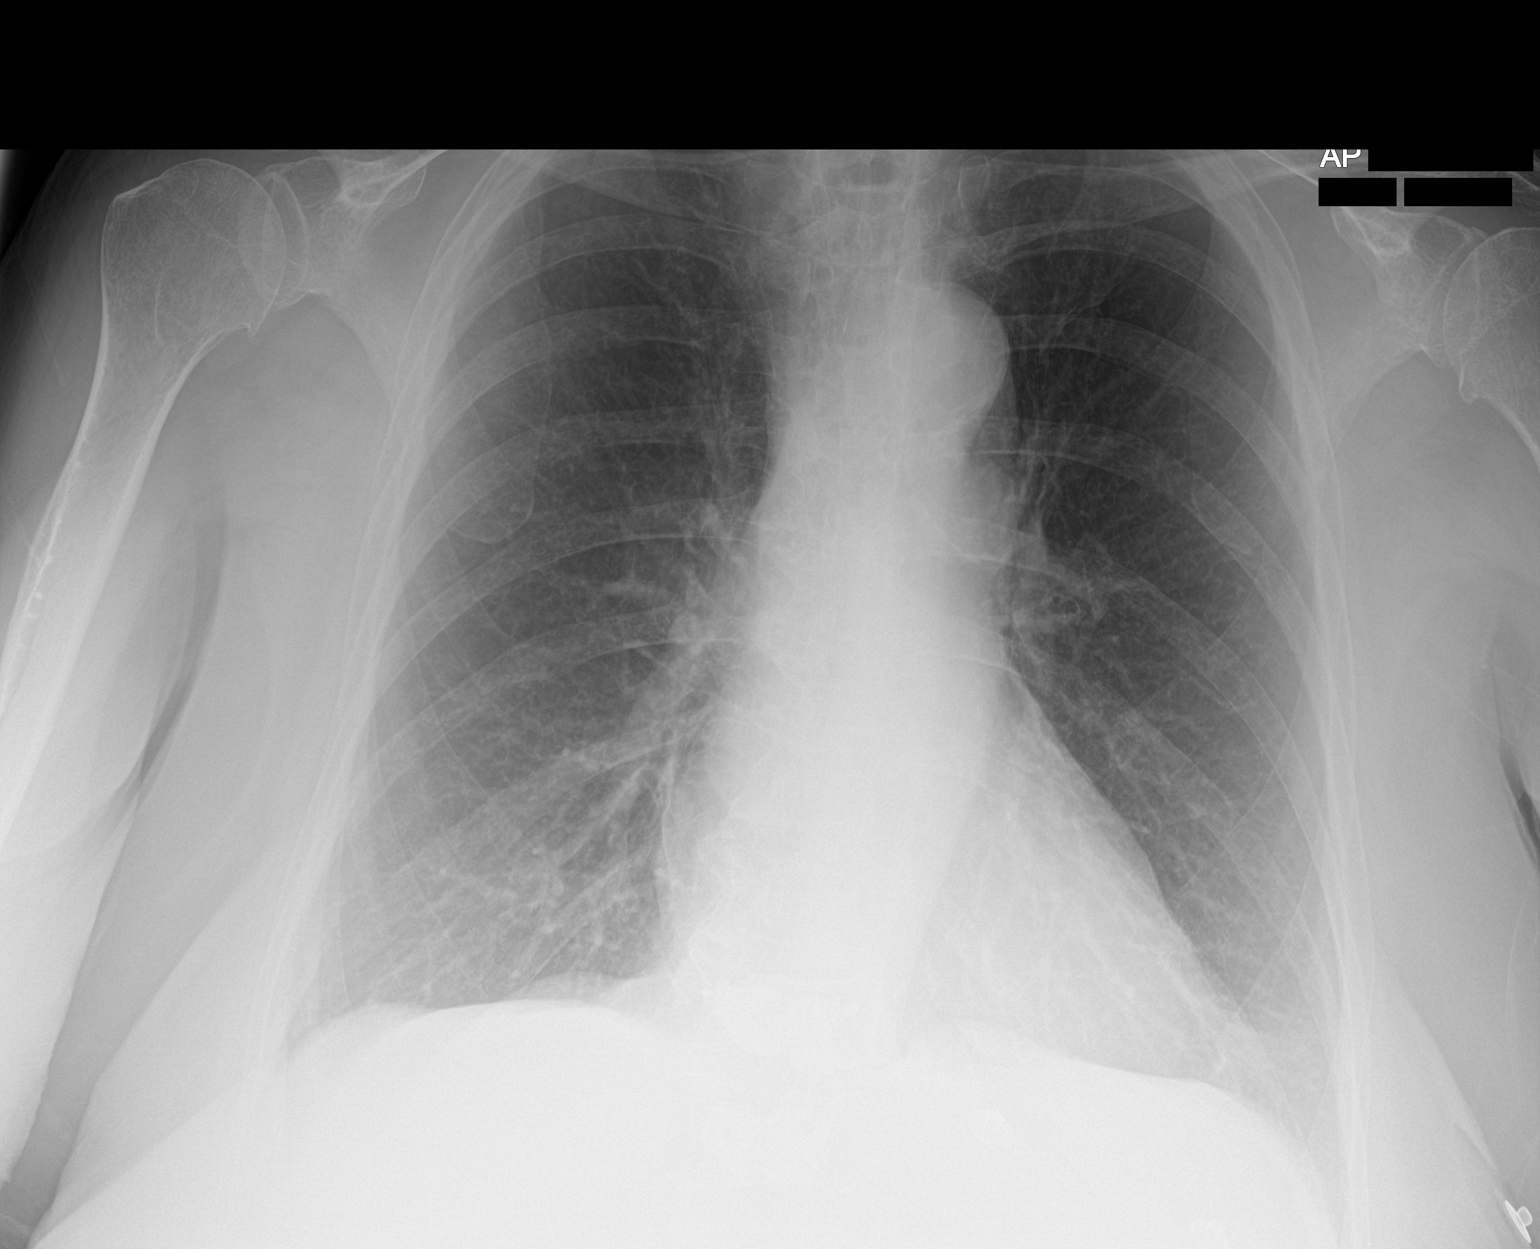

[1 of 1 positions shown; findings below may reference images not displayed]

FINDINGS: The heart size and mediastinal contours are within normal limits.
Both lungs are clear. The visualized skeletal structures are
unremarkable.
IMPRESSION: No active disease.
